# Patient Record
Sex: Female | Born: 1935 | Race: White | Hispanic: No | Marital: Married | Smoking: Never smoker
Health system: Southern US, Community
[De-identification: ages and names within clinical notes are randomized; demographics above are authoritative.]

## PROBLEM LIST (undated history)

## (undated) DIAGNOSIS — R011 Cardiac murmur, unspecified: Secondary | ICD-10-CM

## (undated) DIAGNOSIS — I2510 Atherosclerotic heart disease of native coronary artery without angina pectoris: Secondary | ICD-10-CM

## (undated) DIAGNOSIS — N6019 Diffuse cystic mastopathy of unspecified breast: Secondary | ICD-10-CM

## (undated) DIAGNOSIS — I150 Renovascular hypertension: Secondary | ICD-10-CM

## (undated) DIAGNOSIS — I219 Acute myocardial infarction, unspecified: Secondary | ICD-10-CM

## (undated) DIAGNOSIS — E785 Hyperlipidemia, unspecified: Secondary | ICD-10-CM

## (undated) DIAGNOSIS — D649 Anemia, unspecified: Secondary | ICD-10-CM

## (undated) DIAGNOSIS — K279 Peptic ulcer, site unspecified, unspecified as acute or chronic, without hemorrhage or perforation: Secondary | ICD-10-CM

## (undated) DIAGNOSIS — I878 Other specified disorders of veins: Secondary | ICD-10-CM

## (undated) DIAGNOSIS — K559 Vascular disorder of intestine, unspecified: Secondary | ICD-10-CM

## (undated) DIAGNOSIS — I499 Cardiac arrhythmia, unspecified: Secondary | ICD-10-CM

## (undated) DIAGNOSIS — K219 Gastro-esophageal reflux disease without esophagitis: Secondary | ICD-10-CM

## (undated) DIAGNOSIS — G8929 Other chronic pain: Secondary | ICD-10-CM

## (undated) DIAGNOSIS — I10 Essential (primary) hypertension: Secondary | ICD-10-CM

## (undated) DIAGNOSIS — I509 Heart failure, unspecified: Secondary | ICD-10-CM

## (undated) DIAGNOSIS — E669 Obesity, unspecified: Secondary | ICD-10-CM

## (undated) DIAGNOSIS — N189 Chronic kidney disease, unspecified: Secondary | ICD-10-CM

## (undated) DIAGNOSIS — E079 Disorder of thyroid, unspecified: Secondary | ICD-10-CM

## (undated) DIAGNOSIS — I721 Aneurysm of artery of upper extremity: Secondary | ICD-10-CM

## (undated) HISTORY — PX: MASTECTOMY: SHX3

## (undated) HISTORY — PX: CARDIAC ELECTROPHYSIOLOGY MAPPING AND ABLATION: SHX1292

## (undated) HISTORY — PX: ABDOMINAL HYSTERECTOMY: SHX81

---

## 2004-02-21 ENCOUNTER — Ambulatory Visit: Payer: Self-pay | Admitting: Gastroenterology

## 2004-04-11 ENCOUNTER — Emergency Department: Payer: Self-pay | Admitting: Emergency Medicine

## 2004-04-24 ENCOUNTER — Ambulatory Visit: Payer: Self-pay | Admitting: Internal Medicine

## 2005-04-22 ENCOUNTER — Ambulatory Visit: Payer: Self-pay | Admitting: Unknown Physician Specialty

## 2005-04-29 ENCOUNTER — Inpatient Hospital Stay: Payer: Self-pay | Admitting: Unknown Physician Specialty

## 2006-01-29 ENCOUNTER — Ambulatory Visit: Payer: Self-pay | Admitting: Internal Medicine

## 2006-02-18 ENCOUNTER — Ambulatory Visit: Payer: Self-pay | Admitting: Internal Medicine

## 2006-07-31 ENCOUNTER — Ambulatory Visit: Payer: Self-pay | Admitting: Internal Medicine

## 2007-08-05 ENCOUNTER — Ambulatory Visit: Payer: Self-pay | Admitting: Internal Medicine

## 2008-08-15 ENCOUNTER — Inpatient Hospital Stay: Payer: Self-pay | Admitting: Internal Medicine

## 2010-02-05 ENCOUNTER — Ambulatory Visit: Payer: Self-pay | Admitting: Gastroenterology

## 2010-02-07 LAB — PATHOLOGY REPORT

## 2012-05-20 ENCOUNTER — Ambulatory Visit: Payer: Self-pay | Admitting: Cardiology

## 2012-09-04 ENCOUNTER — Ambulatory Visit: Payer: Self-pay | Admitting: Gastroenterology

## 2012-09-07 LAB — PATHOLOGY REPORT

## 2013-03-31 ENCOUNTER — Emergency Department: Payer: Self-pay | Admitting: Internal Medicine

## 2013-03-31 LAB — COMPREHENSIVE METABOLIC PANEL
ANION GAP: 6 — AB (ref 7–16)
AST: 33 U/L (ref 15–37)
Albumin: 3.4 g/dL (ref 3.4–5.0)
Alkaline Phosphatase: 68 U/L
BUN: 22 mg/dL — ABNORMAL HIGH (ref 7–18)
Bilirubin,Total: 0.6 mg/dL (ref 0.2–1.0)
CALCIUM: 7.9 mg/dL — AB (ref 8.5–10.1)
CO2: 25 mmol/L (ref 21–32)
Chloride: 104 mmol/L (ref 98–107)
Creatinine: 0.94 mg/dL (ref 0.60–1.30)
EGFR (African American): >60
EGFR (Non-African Amer.): 58 — ABNORMAL LOW
GLUCOSE: 115 mg/dL — AB (ref 65–99)
Osmolality: 274 (ref 275–301)
Potassium: 4.7 mmol/L (ref 3.5–5.1)
SGPT (ALT): 26 U/L (ref 12–78)
Sodium: 135 mmol/L — ABNORMAL LOW (ref 136–145)
TOTAL PROTEIN: 6.1 g/dL — AB (ref 6.4–8.2)

## 2013-03-31 LAB — CBC
HCT: 37.3 % (ref 35.0–47.0)
HGB: 13.2 g/dL (ref 12.0–16.0)
MCH: 33.5 pg (ref 26.0–34.0)
MCHC: 35.4 g/dL (ref 32.0–36.0)
MCV: 95 fL (ref 80–100)
PLATELETS: 206 10*3/uL (ref 150–440)
RBC: 3.94 10*6/uL (ref 3.80–5.20)
RDW: 12 % (ref 11.5–14.5)
WBC: 8.3 10*3/uL (ref 3.6–11.0)

## 2013-03-31 LAB — TROPONIN I: Troponin-I: <0.02 ng/mL

## 2014-07-27 ENCOUNTER — Other Ambulatory Visit: Payer: Self-pay | Admitting: Internal Medicine

## 2014-07-27 DIAGNOSIS — E041 Nontoxic single thyroid nodule: Secondary | ICD-10-CM

## 2014-08-04 ENCOUNTER — Ambulatory Visit: Payer: Self-pay

## 2014-08-08 ENCOUNTER — Ambulatory Visit
Admission: RE | Admit: 2014-08-08 | Discharge: 2014-08-08 | Disposition: A | Discharge status: Home / Self Care | Payer: Medicare Other | Source: Ambulatory Visit | Attending: Internal Medicine | Admitting: Internal Medicine

## 2014-08-08 DIAGNOSIS — E041 Nontoxic single thyroid nodule: Secondary | ICD-10-CM | POA: Diagnosis not present

## 2014-08-08 DIAGNOSIS — R591 Generalized enlarged lymph nodes: Secondary | ICD-10-CM | POA: Diagnosis not present

## 2015-11-15 ENCOUNTER — Other Ambulatory Visit: Payer: Self-pay | Admitting: Internal Medicine

## 2015-11-15 DIAGNOSIS — R0602 Shortness of breath: Secondary | ICD-10-CM

## 2015-11-15 DIAGNOSIS — R413 Other amnesia: Secondary | ICD-10-CM

## 2015-11-17 HISTORY — PX: OTHER SURGICAL HISTORY: SHX169

## 2015-11-17 HISTORY — PX: HAND DEBRIDEMENT: SHX974

## 2015-11-27 ENCOUNTER — Ambulatory Visit: Payer: Medicare Other

## 2015-11-27 ENCOUNTER — Ambulatory Visit: Admission: RE | Admit: 2015-11-27 | Payer: Medicare Other | Source: Ambulatory Visit

## 2017-03-24 ENCOUNTER — Other Ambulatory Visit: Payer: Self-pay | Admitting: Internal Medicine

## 2017-03-24 DIAGNOSIS — R202 Paresthesia of skin: Secondary | ICD-10-CM

## 2017-03-24 DIAGNOSIS — M79602 Pain in left arm: Secondary | ICD-10-CM

## 2017-03-24 DIAGNOSIS — M542 Cervicalgia: Secondary | ICD-10-CM

## 2017-03-24 DIAGNOSIS — M79601 Pain in right arm: Secondary | ICD-10-CM

## 2017-03-29 ENCOUNTER — Ambulatory Visit
Admission: RE | Admit: 2017-03-29 | Discharge: 2017-03-29 | Disposition: A | Discharge status: Home / Self Care | Payer: Medicare Other | Source: Ambulatory Visit | Attending: Internal Medicine | Admitting: Internal Medicine

## 2017-03-29 DIAGNOSIS — M79602 Pain in left arm: Secondary | ICD-10-CM | POA: Insufficient documentation

## 2017-03-29 DIAGNOSIS — M4312 Spondylolisthesis, cervical region: Secondary | ICD-10-CM | POA: Insufficient documentation

## 2017-03-29 DIAGNOSIS — M4802 Spinal stenosis, cervical region: Secondary | ICD-10-CM | POA: Diagnosis not present

## 2017-03-29 DIAGNOSIS — R202 Paresthesia of skin: Secondary | ICD-10-CM | POA: Insufficient documentation

## 2017-03-29 DIAGNOSIS — M47812 Spondylosis without myelopathy or radiculopathy, cervical region: Secondary | ICD-10-CM | POA: Diagnosis not present

## 2017-03-29 DIAGNOSIS — M542 Cervicalgia: Secondary | ICD-10-CM

## 2017-03-29 DIAGNOSIS — M79601 Pain in right arm: Secondary | ICD-10-CM | POA: Insufficient documentation

## 2018-11-09 ENCOUNTER — Other Ambulatory Visit: Payer: Self-pay | Admitting: Internal Medicine

## 2018-11-09 DIAGNOSIS — R1010 Upper abdominal pain, unspecified: Secondary | ICD-10-CM

## 2018-11-09 DIAGNOSIS — R194 Change in bowel habit: Secondary | ICD-10-CM

## 2018-11-11 ENCOUNTER — Ambulatory Visit
Admission: RE | Admit: 2018-11-11 | Discharge: 2018-11-11 | Disposition: A | Discharge status: Home / Self Care | Payer: Medicare Other | Source: Ambulatory Visit | Attending: Internal Medicine | Admitting: Internal Medicine

## 2018-11-11 ENCOUNTER — Other Ambulatory Visit: Payer: Self-pay

## 2018-11-11 DIAGNOSIS — R1010 Upper abdominal pain, unspecified: Secondary | ICD-10-CM | POA: Insufficient documentation

## 2018-11-11 DIAGNOSIS — R194 Change in bowel habit: Secondary | ICD-10-CM | POA: Diagnosis present

## 2018-11-11 HISTORY — DX: Essential (primary) hypertension: I10

## 2018-11-11 MED ORDER — IOHEXOL 300 MG/ML  SOLN
75.0000 mL | Freq: Once | INTRAMUSCULAR | Status: AC | PRN
  Administered 2018-11-11: 75 mL via INTRAVENOUS

## 2018-11-24 ENCOUNTER — Other Ambulatory Visit: Payer: Self-pay

## 2018-11-24 ENCOUNTER — Other Ambulatory Visit
Admission: RE | Admit: 2018-11-24 | Discharge: 2018-11-24 | Disposition: A | Discharge status: Home / Self Care | Payer: Medicare Other | Source: Ambulatory Visit | Attending: Gastroenterology | Admitting: Gastroenterology

## 2018-11-24 DIAGNOSIS — Z20828 Contact with and (suspected) exposure to other viral communicable diseases: Secondary | ICD-10-CM | POA: Insufficient documentation

## 2018-11-24 DIAGNOSIS — Z01812 Encounter for preprocedural laboratory examination: Secondary | ICD-10-CM | POA: Diagnosis present

## 2018-11-25 ENCOUNTER — Encounter: Payer: Self-pay | Admitting: *Deleted

## 2018-11-25 LAB — SARS CORONAVIRUS 2 (TAT 6-24 HRS): SARS Coronavirus 2: NEGATIVE

## 2018-11-26 ENCOUNTER — Ambulatory Visit
Admission: RE | Admit: 2018-11-26 | Discharge: 2018-11-26 | Disposition: A | Discharge status: Home / Self Care | Payer: Medicare Other | Attending: Gastroenterology | Admitting: Gastroenterology

## 2018-11-26 ENCOUNTER — Ambulatory Visit: Payer: Medicare Other | Admitting: Anesthesiology

## 2018-11-26 ENCOUNTER — Other Ambulatory Visit: Payer: Self-pay

## 2018-11-26 ENCOUNTER — Encounter
Admission: RE | Disposition: A | Discharge status: Home / Self Care | Payer: Self-pay | Source: Home / Self Care | Attending: Gastroenterology

## 2018-11-26 DIAGNOSIS — N189 Chronic kidney disease, unspecified: Secondary | ICD-10-CM | POA: Insufficient documentation

## 2018-11-26 DIAGNOSIS — I4891 Unspecified atrial fibrillation: Secondary | ICD-10-CM | POA: Insufficient documentation

## 2018-11-26 DIAGNOSIS — R933 Abnormal findings on diagnostic imaging of other parts of digestive tract: Secondary | ICD-10-CM | POA: Diagnosis present

## 2018-11-26 DIAGNOSIS — Z7901 Long term (current) use of anticoagulants: Secondary | ICD-10-CM | POA: Diagnosis not present

## 2018-11-26 DIAGNOSIS — D123 Benign neoplasm of transverse colon: Secondary | ICD-10-CM | POA: Diagnosis not present

## 2018-11-26 DIAGNOSIS — E785 Hyperlipidemia, unspecified: Secondary | ICD-10-CM | POA: Diagnosis not present

## 2018-11-26 DIAGNOSIS — E079 Disorder of thyroid, unspecified: Secondary | ICD-10-CM | POA: Diagnosis not present

## 2018-11-26 DIAGNOSIS — K5730 Diverticulosis of large intestine without perforation or abscess without bleeding: Secondary | ICD-10-CM | POA: Diagnosis not present

## 2018-11-26 DIAGNOSIS — I2510 Atherosclerotic heart disease of native coronary artery without angina pectoris: Secondary | ICD-10-CM | POA: Insufficient documentation

## 2018-11-26 DIAGNOSIS — Z79890 Hormone replacement therapy (postmenopausal): Secondary | ICD-10-CM | POA: Diagnosis not present

## 2018-11-26 DIAGNOSIS — I509 Heart failure, unspecified: Secondary | ICD-10-CM | POA: Insufficient documentation

## 2018-11-26 DIAGNOSIS — Z79899 Other long term (current) drug therapy: Secondary | ICD-10-CM | POA: Diagnosis not present

## 2018-11-26 DIAGNOSIS — K219 Gastro-esophageal reflux disease without esophagitis: Secondary | ICD-10-CM | POA: Diagnosis not present

## 2018-11-26 DIAGNOSIS — I130 Hypertensive heart and chronic kidney disease with heart failure and stage 1 through stage 4 chronic kidney disease, or unspecified chronic kidney disease: Secondary | ICD-10-CM | POA: Diagnosis not present

## 2018-11-26 HISTORY — DX: Heart failure, unspecified: I50.9

## 2018-11-26 HISTORY — DX: Disorder of thyroid, unspecified: E07.9

## 2018-11-26 HISTORY — DX: Hyperlipidemia, unspecified: E78.5

## 2018-11-26 HISTORY — DX: Atherosclerotic heart disease of native coronary artery without angina pectoris: I25.10

## 2018-11-26 HISTORY — DX: Chronic kidney disease, unspecified: N18.9

## 2018-11-26 HISTORY — DX: Other chronic pain: G89.29

## 2018-11-26 HISTORY — DX: Renovascular hypertension: I15.0

## 2018-11-26 HISTORY — DX: Obesity, unspecified: E66.9

## 2018-11-26 HISTORY — DX: Vascular disorder of intestine, unspecified: K55.9

## 2018-11-26 HISTORY — DX: Other specified disorders of veins: I87.8

## 2018-11-26 HISTORY — DX: Peptic ulcer, site unspecified, unspecified as acute or chronic, without hemorrhage or perforation: K27.9

## 2018-11-26 HISTORY — DX: Gastro-esophageal reflux disease without esophagitis: K21.9

## 2018-11-26 HISTORY — DX: Diffuse cystic mastopathy of unspecified breast: N60.19

## 2018-11-26 HISTORY — DX: Anemia, unspecified: D64.9

## 2018-11-26 HISTORY — PX: COLONOSCOPY WITH PROPOFOL: SHX5780

## 2018-11-26 HISTORY — DX: Cardiac arrhythmia, unspecified: I49.9

## 2018-11-26 SURGERY — COLONOSCOPY WITH PROPOFOL
Anesthesia: General

## 2018-11-26 MED ORDER — SODIUM CHLORIDE 0.9 % IV SOLN
INTRAVENOUS | Status: DC
  Administered 2018-11-26: 11:00:00 1000 mL via INTRAVENOUS

## 2018-11-26 MED ORDER — PROPOFOL 10 MG/ML IV BOLUS
INTRAVENOUS | Status: DC | PRN
  Administered 2018-11-26: 80 mg via INTRAVENOUS

## 2018-11-26 MED ORDER — PROPOFOL 500 MG/50ML IV EMUL
INTRAVENOUS | Status: AC
  Filled 2018-11-26: qty 50

## 2018-11-26 MED ORDER — PROPOFOL 500 MG/50ML IV EMUL
INTRAVENOUS | Status: DC | PRN
  Administered 2018-11-26: 130 ug/kg/min via INTRAVENOUS

## 2018-11-26 NOTE — H&P (Signed)
Outpatient short stay form Pre-procedure 11/26/2018 10:55 AM Lollie Sails MD  Primary Physician: Dr. Fulton Reek  Reason for visit: Colonoscopy  History of present illness: Patient is a 85 year old female presenting today for colonoscopy.  She has had a change in bowel habits with increased constipation for.  About a month.  She did have a CT scan of the abdomen pelvis on 11/11/2018.  This showed an abrupt caliber change in the colon at the level of splenic flexure with a large stool burden in the more proximal colon.  Descending colon through the rectum were decompressed.  There was no obvious mass however she is presenting today for further evaluation.  Her last EGD and colonoscopy were 08/04/2012 EGD being normal colonoscopy showing diverticulosis and hemorrhoids.  She tolerated her prep well.  Patient does take Eliquis and her last dose was over 48 hours ago      Current Facility-Administered Medications:  .  0.9 %  sodium chloride infusion, , Intravenous, Continuous, Lollie Sails, MD, Last Rate: 20 mL/hr at 11/26/18 1037, 1,000 mL at 11/26/18 1037  Medications Prior to Admission  Medication Sig Dispense Refill Last Dose  . acetaminophen (TYLENOL) 500 MG tablet Take 1,000 mg by mouth every 6 (six) hours as needed.   Past Week at Unknown time  . ALPRAZolam (XANAX) 0.25 MG tablet Take 0.25 mg by mouth 3 (three) times daily as needed for anxiety or sleep.   11/26/2018 at 0700  . apixaban (ELIQUIS) 5 MG TABS tablet Take 5 mg by mouth every 12 (twelve) hours.   Past Week at Unknown time  . carvedilol (COREG) 25 MG tablet Take 25 mg by mouth every 12 (twelve) hours.   11/25/2018 at Unknown time  . cloNIDine (CATAPRES) 0.1 MG tablet Take 0.1 mg by mouth 2 (two) times daily. Take 2 tablets with breakfast and lunch, 3 tablets at bedtime   11/26/2018 at 0700  . diclofenac sodium (VOLTAREN) 1 % GEL Apply 2 g topically 4 (four) times daily.   Past Week at Unknown time  .  DIPHENHYDRAMINE-PE-APAP PO Take 1 tablet by mouth at bedtime as needed ("Legatrin PM" OTC. For muscle cramps and sleep PRN).   Past Week at Unknown time  . ferrous sulfate 325 (65 FE) MG tablet Take 325 mg by mouth daily with breakfast.   Past Week at Unknown time  . furosemide (LASIX) 40 MG tablet Take 40 mg by mouth 2 (two) times daily.   11/25/2018 at Unknown time  . gabapentin (NEURONTIN) 300 MG capsule Take 300 mg by mouth at bedtime.   Past Week at Unknown time  . hydrALAZINE (APRESOLINE) 50 MG tablet Take 50 mg by mouth 3 (three) times daily.   Past Week at Unknown time  . lansoprazole (PREVACID) 15 MG capsule Take 15 mg by mouth 2 (two) times daily.   11/25/2018 at Unknown time  . levothyroxine (SYNTHROID) 75 MCG tablet Take 75 mcg by mouth daily before breakfast. Take qd on empty stomach with a glass of water at least 30-60 minutes before breakfast   11/25/2018 at Unknown time  . losartan (COZAAR) 100 MG tablet Take 100 mg by mouth daily.   11/25/2018 at Unknown time  . metoCLOPramide (REGLAN) 5 MG tablet Take 5 mg by mouth 3 (three) times daily before meals. For 30 days   Past Week at Unknown time  . pravastatin (PRAVACHOL) 80 MG tablet Take 80 mg by mouth daily. At night   11/25/2018 at Unknown time  .  senna (SENOKOT) 8.6 MG tablet Take 1 tablet by mouth daily.   Past Week at Unknown time  . triamcinolone cream (KENALOG) 0.5 % Apply 1 application topically 2 (two) times daily.   Past Week at Unknown time  . verapamil (CALAN-SR) 180 MG CR tablet Take 180 mg by mouth daily.   Past Week at Unknown time     Allergies  Allergen Reactions  . Amlodipine   . Codeine   . Cortisone   . Lisinopril   . Phenergan [Promethazine]   . Prednisone   . Spironolactone   . Tikosyn [Dofetilide]      Past Medical History:  Diagnosis Date  . Anemia   . CHF (congestive heart failure) ([place])   . Chronic back pain   . Chronic kidney disease   . Coronary artery disease   . Dysrhythmia    A-fib, ventricular  ectopy with palpitations  . Fibrocystic breast disease   . GERD (gastroesophageal reflux disease)   . Hyperlipidemia   . Hypertension   . Ischemic colitis ([place])   . Obesity   . Peptic ulcer disease   . Renovascular hypertension   . Thyroid disease   . Venous stasis     Review of systems:      Physical Exam    Heart and lungs: Regular rate and rhythm without rub or gallop lungs are bilaterally clear    HEENT: Normocephalic atraumatic eyes are anicteric    Other:    Pertinant exam for procedure: Soft mild tenderness to palpation in the right upper quadrant.  There are no masses or rebound.  Sounds are positive normoactive    Planned proceedures: Colonoscopy and indicated procedures. I have discussed the risks benefits and complications of procedures to include not limited to bleeding, infection, perforation and the risk of sedation and the patient wishes to proceed.    Lollie Sails, MD Gastroenterology 11/26/2018  10:55 AM

## 2018-11-26 NOTE — Anesthesia Post-op Follow-up Note (Signed)
Anesthesia QCDR form completed.        

## 2018-11-26 NOTE — Transfer of Care (Signed)
Immediate Anesthesia Transfer of Care Note  Patient: Andrea Haley  Procedure(s) Performed: COLONOSCOPY WITH PROPOFO (N/A )  Patient Location: PACU  Anesthesia Type:General  Level of Consciousness: awake  Airway & Oxygen Therapy: Patient Spontanous Breathing and Patient connected to nasal cannula oxygen  Post-op Assessment: Report given to RN and Post -op Vital signs reviewed and stable  Post vital signs: Reviewed and stable  Last Vitals:  Vitals Value Taken Time  BP 159/68 11/26/18 1313  Temp    Pulse 58 11/26/18 1315  Resp 15 11/26/18 1315  SpO2 99 % 11/26/18 1315  Vitals shown include unvalidated device data.  Last Pain:  Vitals:   11/26/18 1020  TempSrc: Tympanic  PainSc: 0-No pain         Complications: No apparent anesthesia complications

## 2018-11-26 NOTE — Op Note (Signed)
[facility] Gastroenterology Patient Name: Jessamyn Letson Procedure Date: 11/26/2018 12:08 PM MRN: [id number] Account #: [id number] Date of Birth: [birth year] Admit Type: Outpatient Age: 85 Room: [facility] ENDO ROOM 3 Gender: Female Note Status: Finalized Procedure:            Colonoscopy Indications:          Abnormal CT of the GI tract Providers:            Lollie Sails, MD Complications:        No immediate complications. Procedure:            Pre-Anesthesia Assessment:                       - ASA Grade Assessment: III - A patient with severe                        systemic disease.                       After obtaining informed consent, the colonoscope was                        passed under direct vision. Throughout the procedure,                        the patient's blood pressure, pulse, and oxygen                        saturations were monitored continuously. The                        Colonoscope was introduced through the anus and                        advanced to the the cecum, identified by appendiceal                        orifice and ileocecal valve. The colonoscopy was                        performed without difficulty. The patient tolerated the                        procedure well. The quality of the bowel preparation                        was good. Findings:      Multiple small-mouthed diverticula were found in the sigmoid colon and       descending colon.      A 4 mm polyp was found in the proximal transverse colon. The polyp was       sessile. The polyp was removed with a cold snare. Resection and       retrieval were complete.      The exam was otherwise normal throughout the examined colon.      The terminal ileum appeared normal. Impression:           - Diverticulosis in the sigmoid colon and in the                        descending colon.                       -  One 4 mm polyp in the proximal transverse colon,   removed with a cold snare. Resected and retrieved.                       - The examined portion of the ileum was normal. Recommendation:       - Discharge patient to home.                       - Miralax 1 capful (17 grams) in 8 ounces of water PO                        daily.                       - Return to GI clinic in 3 weeks. Procedure Code(s):    --- Professional ---                       [phone], Colonoscopy, flexible; with removal of tumor(s),                        polyp(s), or other lesion(s) by snare technique Diagnosis Code(s):    --- Professional ---                       K63.5, Polyp of colon                       K57.30, Diverticulosis of large intestine without                        perforation or abscess without bleeding                       R93.3, Abnormal findings on diagnostic imaging of other                        parts of digestive tract CPT copyright 2019 American Medical Association. All rights reserved. The codes documented in this report are preliminary and upon coder review may  be revised to meet current compliance requirements. Lollie Sails, MD 11/26/2018 1:07:42 PM This report has been signed electronically. Number of Addenda: 0 Note Initiated On: 11/26/2018 12:08 PM Scope Withdrawal Time: 0 hours 8 minutes 17 seconds  Total Procedure Duration: 0 hours 26 minutes 52 seconds       [facility]

## 2018-11-26 NOTE — Anesthesia Preprocedure Evaluation (Signed)
Anesthesia Evaluation  Patient identified by MRN, date of birth, ID band Patient awake    Reviewed: Allergy & Precautions, H&P , NPO status , Patient's Chart, lab work & pertinent test results  Airway Mallampati: II  TM Distance: >3 FB Neck ROM: full    Dental  (+) Upper Dentures, Lower Dentures   Pulmonary neg pulmonary ROS,           Cardiovascular hypertension, + CAD and +CHF  + dysrhythmias Atrial Fibrillation      Neuro/Psych negative neurological ROS  negative psych ROS   GI/Hepatic Neg liver ROS, PUD, GERD  Controlled,  Endo/Other  negative endocrine ROS  Renal/GU CRFRenal disease  negative genitourinary   Musculoskeletal   Abdominal   Peds  Hematology   Anesthesia Other Findings Past Medical History: No date: Anemia No date: CHF (congestive heart failure) (HCC) No date: Chronic back pain No date: Chronic kidney disease No date: Coronary artery disease No date: Dysrhythmia     Comment:  A-fib, ventricular ectopy with palpitations No date: Fibrocystic breast disease No date: GERD (gastroesophageal reflux disease) No date: Hyperlipidemia No date: Hypertension No date: Ischemic colitis (HCC) No date: Obesity No date: Peptic ulcer disease No date: Renovascular hypertension No date: Thyroid disease No date: Venous stasis    BMI    Body Mass Index: 27.92 kg/m      Reproductive/Obstetrics negative OB ROS                             Anesthesia Physical Anesthesia Plan  ASA: III  Anesthesia Plan: General   Post-op Pain Management:    Induction:   PONV Risk Score and Plan: Propofol infusion and TIVA  Airway Management Planned: Natural Airway and Nasal Cannula  Additional Equipment:   Intra-op Plan:   Post-operative Plan:   Informed Consent: I have reviewed the patients History and Physical, chart, labs and discussed the procedure including the risks, benefits  and alternatives for the proposed anesthesia with the patient or authorized representative who has indicated his/her understanding and acceptance.     Dental Advisory Given  Plan Discussed with: Anesthesiologist and CRNA  Anesthesia Plan Comments:         Anesthesia Quick Evaluation

## 2018-11-27 ENCOUNTER — Encounter: Payer: Self-pay | Admitting: Gastroenterology

## 2018-11-27 LAB — SURGICAL PATHOLOGY

## 2018-11-27 NOTE — Anesthesia Postprocedure Evaluation (Signed)
Anesthesia Post Note  Patient: Andrea Haley  Procedure(s) Performed: COLONOSCOPY WITH PROPOFO (N/A )  Patient location during evaluation: PACU Anesthesia Type: General Level of consciousness: awake and alert Pain management: pain level controlled Vital Signs Assessment: post-procedure vital signs reviewed and stable Respiratory status: spontaneous breathing, nonlabored ventilation and respiratory function stable Cardiovascular status: blood pressure returned to baseline and stable Postop Assessment: no apparent nausea or vomiting Anesthetic complications: no     Last Vitals:  Vitals:   11/26/18 1333 11/26/18 1353  BP: (!) 164/68 (!) 168/74  Pulse:    Resp:    Temp:    SpO2:      Last Pain:  Vitals:   11/27/18 0717  TempSrc:   PainSc: 0-No pain                 Durenda Hurt

## 2019-04-26 ENCOUNTER — Other Ambulatory Visit: Payer: Self-pay | Admitting: General Surgery

## 2019-04-26 MED ORDER — GENERIC EXTERNAL MEDICATION
Status: DC
Start: ? — End: 2019-04-26

## 2019-04-26 MED ORDER — ALPRAZOLAM 0.25 MG PO TABS
0.25 | ORAL_TABLET | ORAL | Status: DC
Start: 2019-04-24 — End: 2019-04-26

## 2019-04-26 MED ORDER — GABAPENTIN 300 MG PO CAPS
300.00 | ORAL_CAPSULE | ORAL | Status: DC
Start: ? — End: 2019-04-26

## 2019-04-26 MED ORDER — APIXABAN 5 MG PO TABS
5.00 | ORAL_TABLET | ORAL | Status: DC
Start: 2019-04-24 — End: 2019-04-26

## 2019-04-26 MED ORDER — LOSARTAN POTASSIUM 50 MG PO TABS
100.00 | ORAL_TABLET | ORAL | Status: DC
Start: ? — End: 2019-04-26

## 2019-04-26 MED ORDER — HYDRALAZINE HCL 50 MG PO TABS
50.00 | ORAL_TABLET | ORAL | Status: DC
Start: 2019-04-24 — End: 2019-04-26

## 2019-04-26 MED ORDER — PRAVASTATIN SODIUM 20 MG PO TABS
80.00 | ORAL_TABLET | ORAL | Status: DC
Start: ? — End: 2019-04-26

## 2019-04-26 MED ORDER — VERAPAMIL HCL ER 180 MG PO TBCR
180.00 | EXTENDED_RELEASE_TABLET | ORAL | Status: DC
Start: 2019-04-25 — End: 2019-04-26

## 2019-04-26 MED ORDER — LEVOTHYROXINE SODIUM 75 MCG PO TABS
75.00 | ORAL_TABLET | ORAL | Status: DC
Start: 2019-04-25 — End: 2019-04-26

## 2019-04-26 MED ORDER — CARVEDILOL 25 MG PO TABS
25.00 | ORAL_TABLET | ORAL | Status: DC
Start: 2019-04-24 — End: 2019-04-26

## 2019-04-26 MED ORDER — FUROSEMIDE 40 MG PO TABS
40.00 | ORAL_TABLET | ORAL | Status: DC
Start: 2019-04-25 — End: 2019-04-26

## 2019-04-26 MED ORDER — CLONIDINE HCL 0.3 MG PO TABS
0.30 | ORAL_TABLET | ORAL | Status: DC
Start: ? — End: 2019-04-26

## 2019-04-27 ENCOUNTER — Other Ambulatory Visit: Payer: Self-pay | Admitting: General Surgery

## 2019-04-27 ENCOUNTER — Encounter: Payer: Self-pay | Admitting: General Surgery

## 2019-04-27 NOTE — H&P (Signed)
Andrea HELDENBRAND [id number] Dec 01, 1931     HPI:  86 year old woman recently reporting decreased exercise tolerance, fatigue as well as weakness with SOB.  BP was found to be low, ans subsequent CBC showed a profound fall in her HGB from her baseline of 9.8 down to 6.7.  She was subsequently sent to [facility] ED for transfusion (multiple known anti-bodies making crossmatch difficult) and the discharged for outpatient GI follow up.  The daughter called Nathaniel Man( (by her report) and was told the earliest the patient could get an appointment was September.  She is seen today to discuss endoscopic evaluation. She reports her stools are dark from longstanding iron therapy.  She has not seen any gross blood.  Stools are chronically described as "tarry" which she has attributed to the iron therapy.   Comprehenisve metablic panel of [id number] did not show an elevated BUN to suggest an UGI bleed.   Colonoscopy completed for constipation and an abnormal CT showing a transition point at the splenic flexure identified a 4 mm polyp (tubular adenoma) and diverticulosis.    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 Baseline HGB 11-12  From 2017- 2020.   The patient reports she has been in sinus rhythm since cardiac ablation 4 years ago.   Frequently labile blood pressure on record review.   (Not in a hospital admission)  Allergies  Allergen Reactions  . Amlodipine   . Codeine   . Cortisone   . Lisinopril   . Phenergan [Promethazine]   . Prednisone   . Spironolactone   . Tikosyn [Dofetilide]    Past Medical History:  Diagnosis Date  . Anemia   . CHF (congestive heart failure) ([place])   . Chronic back pain   . Chronic kidney disease   . Coronary artery disease   . Dysrhythmia    A-fib, ventricular ectopy with palpitations  . Fibrocystic breast disease   . GERD (gastroesophageal reflux disease)   . Hyperlipidemia   . Hypertension   .  Ischemic colitis ([place])   . Obesity   . Peptic ulcer disease   . Renovascular hypertension   . Thyroid disease   . Venous stasis    Social History   Socioeconomic History  . Marital status: Married    Spouse name: Not on file  . Number of children: Not on file  . Years of education: Not on file  . Highest education level: Not on file  Occupational History  . Not on file  Tobacco Use  . Smoking status: Not on file  Substance and Sexual Activity  . Alcohol use: Not on file  . Drug use: Not on file  . Sexual activity: Not on file  Other Topics Concern  . Not on file  Social History Narrative  . Not on file   Social Determinants of Health   Financial Resource Strain:   . Difficulty of Paying Living Expenses: Not on file  Food Insecurity:   . Worried About Charity fundraiser in the Last Year: Not on file  . Ran Out of Food in the Last Year: Not on file  Transportation Needs:   . Lack of Transportation (Medical): Not on file  . Lack of Transportation (Non-Medical): Not on file  Physical Activity:   . Days of Exercise per Week: Not on file  . Minutes of Exercise per Session: Not on file  Stress:   . Feeling of  Stress : Not on file  Social Connections:   . Frequency of Communication with Friends and Family: Not on file  . Frequency of Social Gatherings with Friends and Family: Not on file  . Attends Religious Services: Not on file  . Active Member of Clubs or Organizations: Not on file  . Attends Archivist Meetings: Not on file  . Marital Status: Not on file  Intimate Partner Violence:   . Fear of Current or Ex-Partner: Not on file  . Emotionally Abused: Not on file  . Physically Abused: Not on file  . Sexually Abused: Not on file   Social History   Social History Narrative  . Not on file     ROS: Negative.     PE: HEENT: Negative. Lungs: Clear. Cardio: RR.  The patient has held her Eliquis since the AM dose on April 26, 2019 anticipating  EGD on February 10.   Assessment/Plan:  Proceed with planned endoscopy.  Forest Gleason [facility] 04/27/2019

## 2019-04-28 ENCOUNTER — Encounter
Admission: RE | Disposition: A | Discharge status: Home / Self Care | Payer: Self-pay | Source: Home / Self Care | Attending: General Surgery

## 2019-04-28 ENCOUNTER — Encounter: Payer: Self-pay | Admitting: General Surgery

## 2019-04-28 ENCOUNTER — Other Ambulatory Visit: Payer: Self-pay

## 2019-04-28 ENCOUNTER — Ambulatory Visit: Payer: Medicare PPO | Admitting: Registered Nurse

## 2019-04-28 ENCOUNTER — Ambulatory Visit
Admission: RE | Admit: 2019-04-28 | Discharge: 2019-04-28 | Disposition: A | Discharge status: Home / Self Care | Payer: Medicare PPO | Attending: General Surgery | Admitting: General Surgery

## 2019-04-28 DIAGNOSIS — K449 Diaphragmatic hernia without obstruction or gangrene: Secondary | ICD-10-CM | POA: Insufficient documentation

## 2019-04-28 DIAGNOSIS — Z8249 Family history of ischemic heart disease and other diseases of the circulatory system: Secondary | ICD-10-CM | POA: Insufficient documentation

## 2019-04-28 DIAGNOSIS — N6019 Diffuse cystic mastopathy of unspecified breast: Secondary | ICD-10-CM | POA: Insufficient documentation

## 2019-04-28 DIAGNOSIS — I2510 Atherosclerotic heart disease of native coronary artery without angina pectoris: Secondary | ICD-10-CM | POA: Insufficient documentation

## 2019-04-28 DIAGNOSIS — Z79899 Other long term (current) drug therapy: Secondary | ICD-10-CM | POA: Insufficient documentation

## 2019-04-28 DIAGNOSIS — I252 Old myocardial infarction: Secondary | ICD-10-CM | POA: Diagnosis not present

## 2019-04-28 DIAGNOSIS — Z9013 Acquired absence of bilateral breasts and nipples: Secondary | ICD-10-CM | POA: Diagnosis not present

## 2019-04-28 DIAGNOSIS — I509 Heart failure, unspecified: Secondary | ICD-10-CM | POA: Diagnosis not present

## 2019-04-28 DIAGNOSIS — R011 Cardiac murmur, unspecified: Secondary | ICD-10-CM | POA: Insufficient documentation

## 2019-04-28 DIAGNOSIS — Z90710 Acquired absence of both cervix and uterus: Secondary | ICD-10-CM | POA: Insufficient documentation

## 2019-04-28 DIAGNOSIS — I4891 Unspecified atrial fibrillation: Secondary | ICD-10-CM | POA: Insufficient documentation

## 2019-04-28 DIAGNOSIS — K921 Melena: Secondary | ICD-10-CM | POA: Insufficient documentation

## 2019-04-28 DIAGNOSIS — R002 Palpitations: Secondary | ICD-10-CM | POA: Diagnosis not present

## 2019-04-28 DIAGNOSIS — I130 Hypertensive heart and chronic kidney disease with heart failure and stage 1 through stage 4 chronic kidney disease, or unspecified chronic kidney disease: Secondary | ICD-10-CM | POA: Insufficient documentation

## 2019-04-28 DIAGNOSIS — Z87891 Personal history of nicotine dependence: Secondary | ICD-10-CM | POA: Insufficient documentation

## 2019-04-28 DIAGNOSIS — I721 Aneurysm of artery of upper extremity: Secondary | ICD-10-CM | POA: Diagnosis not present

## 2019-04-28 DIAGNOSIS — E079 Disorder of thyroid, unspecified: Secondary | ICD-10-CM | POA: Diagnosis not present

## 2019-04-28 DIAGNOSIS — Z888 Allergy status to other drugs, medicaments and biological substances status: Secondary | ICD-10-CM | POA: Insufficient documentation

## 2019-04-28 DIAGNOSIS — K279 Peptic ulcer, site unspecified, unspecified as acute or chronic, without hemorrhage or perforation: Secondary | ICD-10-CM | POA: Diagnosis not present

## 2019-04-28 DIAGNOSIS — G8929 Other chronic pain: Secondary | ICD-10-CM | POA: Insufficient documentation

## 2019-04-28 DIAGNOSIS — I878 Other specified disorders of veins: Secondary | ICD-10-CM | POA: Diagnosis not present

## 2019-04-28 DIAGNOSIS — M549 Dorsalgia, unspecified: Secondary | ICD-10-CM | POA: Diagnosis not present

## 2019-04-28 DIAGNOSIS — K219 Gastro-esophageal reflux disease without esophagitis: Secondary | ICD-10-CM | POA: Insufficient documentation

## 2019-04-28 DIAGNOSIS — Z885 Allergy status to narcotic agent status: Secondary | ICD-10-CM | POA: Insufficient documentation

## 2019-04-28 DIAGNOSIS — D631 Anemia in chronic kidney disease: Secondary | ICD-10-CM | POA: Insufficient documentation

## 2019-04-28 DIAGNOSIS — N189 Chronic kidney disease, unspecified: Secondary | ICD-10-CM | POA: Diagnosis not present

## 2019-04-28 DIAGNOSIS — K3189 Other diseases of stomach and duodenum: Secondary | ICD-10-CM | POA: Diagnosis not present

## 2019-04-28 DIAGNOSIS — E785 Hyperlipidemia, unspecified: Secondary | ICD-10-CM | POA: Diagnosis not present

## 2019-04-28 DIAGNOSIS — Z7901 Long term (current) use of anticoagulants: Secondary | ICD-10-CM | POA: Insufficient documentation

## 2019-04-28 HISTORY — DX: Cardiac murmur, unspecified: R01.1

## 2019-04-28 HISTORY — DX: Aneurysm of artery of upper extremity: I72.1

## 2019-04-28 HISTORY — PX: ESOPHAGOGASTRODUODENOSCOPY (EGD) WITH PROPOFOL: SHX5813

## 2019-04-28 HISTORY — DX: Acute myocardial infarction, unspecified: I21.9

## 2019-04-28 SURGERY — ESOPHAGOGASTRODUODENOSCOPY (EGD) WITH PROPOFOL
Anesthesia: General

## 2019-04-28 MED ORDER — PROPOFOL 10 MG/ML IV BOLUS
INTRAVENOUS | Status: DC | PRN
  Administered 2019-04-28: 10 mg via INTRAVENOUS
  Administered 2019-04-28: 60 mg via INTRAVENOUS

## 2019-04-28 MED ORDER — SODIUM CHLORIDE 0.9 % IV SOLN: INTRAVENOUS | Status: DC

## 2019-04-28 MED ORDER — LIDOCAINE HCL (CARDIAC) PF 100 MG/5ML IV SOSY
PREFILLED_SYRINGE | INTRAVENOUS | Status: DC | PRN
  Administered 2019-04-28: 60 mg via INTRAVENOUS

## 2019-04-28 NOTE — Anesthesia Preprocedure Evaluation (Addendum)
Anesthesia Evaluation  Patient identified by MRN, date of birth, ID band Patient awake    Reviewed: Allergy & Precautions, H&P , NPO status , Patient's Chart, lab work & pertinent test results  Airway Mallampati: II  TM Distance: >3 FB Neck ROM: full    Dental  (+) Edentulous Lower, Edentulous Upper   Pulmonary neg pulmonary ROS, neg shortness of breath, neg COPD,           Cardiovascular hypertension, (-) angina+ CAD, + Past MI and +CHF  + dysrhythmias Atrial Fibrillation + Valvular Problems/Murmurs   NORMAL LEFT VENTRICULAR SYSTOLIC FUNCTION WITH MILD LVH  NORMAL RIGHT VENTRICULAR SYSTOLIC FUNCTION  VALVULAR REGURGITATION: TRIVIAL AR, MILD MR, TRIVIAL PR, MILD TR  NO VALVULAR STENOSIS  PFO SEEN BY COLOR AND SPECTRAL DOPPLER.   Neuro/Psych negative neurological ROS  negative psych ROS   GI/Hepatic Neg liver ROS, PUD, GERD  ,  Endo/Other  negative endocrine ROS  Renal/GU Renal disease  negative genitourinary   Musculoskeletal   Abdominal   Peds  Hematology  (+) Blood dyscrasia, anemia ,   Anesthesia Other Findings Past Medical History: No date: Anemia No date: Aneurysm of left radial artery (HCC) No date: CHF (congestive heart failure) (HCC) No date: Chronic back pain No date: Chronic kidney disease No date: Coronary artery disease No date: Dysrhythmia     Comment:  A-fib, ventricular ectopy with palpitations No date: Fibrocystic breast disease No date: GERD (gastroesophageal reflux disease) No date: Heart murmur No date: Hyperlipidemia No date: Hypertension No date: Ischemic colitis ([place]) No date: Myocardial infarction ([place]) No date: Obesity No date: Peptic ulcer disease No date: Renovascular hypertension No date: Thyroid disease No date: Venous stasis  Past Surgical History: No date: ABDOMINAL HYSTERECTOMY No date: CARDIAC ELECTROPHYSIOLOGY MAPPING AND ABLATION 11/26/2018: COLONOSCOPY WITH  PROPOFOL; N/A     Comment:  Procedure: COLONOSCOPY WITH PROPOFO;  Surgeon: Lollie Sails, MD;  Location: [facility] ENDOSCOPY;  Service:               Endoscopy;  Laterality: N/A; 11/2015: HAND DEBRIDEMENT; Left     Comment:  Debridement skin/SQ tissue, wrist/hand/finger No date: MASTECTOMY; Bilateral 11/2015: radial artery repair; Left     Comment:  aneurysm/pseudoaneurysm repair, graft insertion of               radial/ulnar artery     Reproductive/Obstetrics negative OB ROS                            Anesthesia Physical Anesthesia Plan  ASA: III  Anesthesia Plan: General   Post-op Pain Management:    Induction:   PONV Risk Score and Plan: Propofol infusion and TIVA  Airway Management Planned: Natural Airway and Nasal Cannula  Additional Equipment:   Intra-op Plan:   Post-operative Plan:   Informed Consent: I have reviewed the patients History and Physical, chart, labs and discussed the procedure including the risks, benefits and alternatives for the proposed anesthesia with the patient or authorized representative who has indicated his/her understanding and acceptance.     Dental Advisory Given  Plan Discussed with: Anesthesiologist  Anesthesia Plan Comments:        Anesthesia Quick Evaluation

## 2019-04-28 NOTE — Transfer of Care (Signed)
Immediate Anesthesia Transfer of Care Note  Patient: [name]  Procedure(s) Performed: ESOPHAGOGASTRODUODENOSCOPY (EGD) WITH PROPOFOL (N/A )  Patient Location: PACU  Anesthesia Type:General  Level of Consciousness: sedated  Airway & Oxygen Therapy: Patient Spontanous Breathing  Post-op Assessment: Report given to RN and Post -op Vital signs reviewed and stable  Post vital signs: Reviewed and stable  Last Vitals:  Vitals Value Taken Time  BP 144/50 04/28/19 0902  Temp    Pulse 57 04/28/19 0903  Resp 15 04/28/19 0903  SpO2 100 % 04/28/19 0903  Vitals shown include unvalidated device data.  Last Pain: There were no vitals filed for this visit.       Complications: No apparent anesthesia complications

## 2019-04-28 NOTE — H&P (Signed)
No change from yesterday's exam.

## 2019-04-28 NOTE — Op Note (Signed)
[facility] Gastroenterology Patient Name: Andrea Haley Procedure Date: 04/28/2019 8:48 AM MRN: [id number] Account #: [id number] Date of Birth: 05/07/1931 Admit Type: Outpatient Age: 86 Room: [facility] ENDO ROOM 1 Gender: Female Note Status: Finalized Procedure:             Upper GI endoscopy Indications:           Melena Providers:             Robert Bellow, MD Medicines:             Monitored Anesthesia Care Complications:         No immediate complications. Procedure:             Pre-Anesthesia Assessment:                        - Prior to the procedure, a History and Physical was                         performed, and patient medications, allergies and                         sensitivities were reviewed. The patient's tolerance                         of previous anesthesia was reviewed.                        - The risks and benefits of the procedure and the                         sedation options and risks were discussed with the                         patient. All questions were answered and informed                         consent was obtained.                        After obtaining informed consent, the endoscope was                         passed under direct vision. Throughout the procedure,                         the patient's blood pressure, pulse, and oxygen                         saturations were monitored continuously. The Endoscope                         was introduced through the mouth, and advanced to the                         fourth part of duodenum. The upper GI endoscopy was                         accomplished without difficulty. The patient tolerated  the procedure well. Findings:      A small hiatal hernia was present.      Diffuse mild mucosal variance characterized by discoloration was found       in the prepyloric region of the stomach. Biopsies were taken with a cold       forceps for histology.      The  examined duodenum was normal. Impression:            - Small hiatal hernia.                        - Gastric mucosal variant. Biopsied.                        - Normal examined duodenum. Recommendation:        - To visualize the small bowel, perform video capsule                         endoscopy at appointment to be scheduled. Procedure Code(s):     --- Professional ---                        [id number], Esophagogastroduodenoscopy, flexible,                         transoral; with biopsy, single or multiple Diagnosis Code(s):     --- Professional ---                        K44.9, Diaphragmatic hernia without obstruction or                         gangrene                        K31.89, Other diseases of stomach and duodenum                        K92.1, Melena (includes Hematochezia) CPT copyright 2019 American Medical Association. All rights reserved. The codes documented in this report are preliminary and upon coder review may  be revised to meet current compliance requirements. Robert Bellow, MD 04/28/2019 9:02:16 AM This report has been signed electronically. Number of Addenda: 0 Note Initiated On: 04/28/2019 8:48 AM Estimated Blood Loss:  Estimated blood loss: none.      [facility]

## 2019-04-28 NOTE — Anesthesia Postprocedure Evaluation (Signed)
Anesthesia Post Note  Patient: Andrea Haley  Procedure(s) Performed: ESOPHAGOGASTRODUODENOSCOPY (EGD) WITH PROPOFOL (N/A )  Patient location during evaluation: Endoscopy Anesthesia Type: General Level of consciousness: awake and alert Pain management: pain level controlled Vital Signs Assessment: post-procedure vital signs reviewed and stable Respiratory status: spontaneous breathing, nonlabored ventilation, respiratory function stable and patient connected to nasal cannula oxygen Cardiovascular status: blood pressure returned to baseline and stable Postop Assessment: no apparent nausea or vomiting Anesthetic complications: no     Last Vitals:  Vitals:   04/28/19 0902  BP: (!) 144/50  Pulse: (!) 56  Resp: 12  Temp: 36.6 C  SpO2: 100%    Last Pain:  Vitals:   04/28/19 0932  TempSrc:   PainSc: 0-No pain                 Precious Haws Gwyn Hieronymus

## 2019-04-29 ENCOUNTER — Encounter: Payer: Self-pay | Admitting: *Deleted

## 2019-04-30 ENCOUNTER — Other Ambulatory Visit: Payer: Self-pay | Admitting: General Surgery

## 2019-04-30 DIAGNOSIS — K921 Melena: Secondary | ICD-10-CM

## 2019-04-30 LAB — SURGICAL PATHOLOGY

## 2019-05-03 ENCOUNTER — Other Ambulatory Visit: Payer: Self-pay | Admitting: General Surgery

## 2019-05-04 ENCOUNTER — Other Ambulatory Visit
Admission: RE | Admit: 2019-05-04 | Discharge: 2019-05-04 | Disposition: A | Discharge status: Home / Self Care | Payer: Medicare PPO | Source: Ambulatory Visit | Attending: General Surgery | Admitting: General Surgery

## 2019-05-04 ENCOUNTER — Encounter
Admission: RE | Admit: 2019-05-04 | Discharge: 2019-05-04 | Disposition: A | Discharge status: Home / Self Care | Payer: Medicare PPO | Source: Ambulatory Visit | Attending: General Surgery | Admitting: General Surgery

## 2019-05-04 ENCOUNTER — Other Ambulatory Visit: Payer: Self-pay

## 2019-05-04 DIAGNOSIS — K921 Melena: Secondary | ICD-10-CM | POA: Diagnosis not present

## 2019-05-04 LAB — HEMOGLOBIN AND HEMATOCRIT, BLOOD
HCT: 26 % — ABNORMAL LOW (ref 36.0–46.0)
Hemoglobin: 8.4 g/dL — ABNORMAL LOW (ref 12.0–15.0)

## 2019-05-04 MED ORDER — SODIUM PERTECHNETATE TC 99M INJECTION
9.9800 | Freq: Once | INTRAVENOUS | Status: AC | PRN
  Administered 2019-05-04: 9.98 via INTRAVENOUS

## 2019-05-06 ENCOUNTER — Ambulatory Visit
Admission: RE | Admit: 2019-05-06 | Discharge: 2019-05-06 | Disposition: A | Discharge status: Home / Self Care | Payer: Medicare PPO | Attending: General Surgery | Admitting: General Surgery

## 2019-05-06 ENCOUNTER — Other Ambulatory Visit: Payer: Self-pay

## 2019-05-06 ENCOUNTER — Encounter
Admission: RE | Disposition: A | Discharge status: Home / Self Care | Payer: Self-pay | Source: Home / Self Care | Attending: General Surgery

## 2019-05-06 DIAGNOSIS — E669 Obesity, unspecified: Secondary | ICD-10-CM | POA: Diagnosis not present

## 2019-05-06 DIAGNOSIS — M549 Dorsalgia, unspecified: Secondary | ICD-10-CM | POA: Diagnosis not present

## 2019-05-06 DIAGNOSIS — I2510 Atherosclerotic heart disease of native coronary artery without angina pectoris: Secondary | ICD-10-CM | POA: Insufficient documentation

## 2019-05-06 DIAGNOSIS — I509 Heart failure, unspecified: Secondary | ICD-10-CM | POA: Diagnosis not present

## 2019-05-06 DIAGNOSIS — I110 Hypertensive heart disease with heart failure: Secondary | ICD-10-CM | POA: Diagnosis not present

## 2019-05-06 DIAGNOSIS — K921 Melena: Secondary | ICD-10-CM | POA: Diagnosis present

## 2019-05-06 DIAGNOSIS — K449 Diaphragmatic hernia without obstruction or gangrene: Secondary | ICD-10-CM | POA: Diagnosis not present

## 2019-05-06 DIAGNOSIS — G8929 Other chronic pain: Secondary | ICD-10-CM | POA: Diagnosis not present

## 2019-05-06 DIAGNOSIS — K219 Gastro-esophageal reflux disease without esophagitis: Secondary | ICD-10-CM | POA: Diagnosis not present

## 2019-05-06 DIAGNOSIS — K3189 Other diseases of stomach and duodenum: Secondary | ICD-10-CM | POA: Diagnosis not present

## 2019-05-06 DIAGNOSIS — E785 Hyperlipidemia, unspecified: Secondary | ICD-10-CM | POA: Diagnosis not present

## 2019-05-06 HISTORY — PX: GIVENS CAPSULE STUDY: SHX5432

## 2019-05-06 SURGERY — IMAGING PROCEDURE, GI TRACT, INTRALUMINAL, VIA CAPSULE

## 2019-05-07 ENCOUNTER — Encounter: Payer: Self-pay | Admitting: *Deleted

## 2019-05-08 NOTE — H&P (Signed)
Patient for capsule endoscopy to assess for occult bleeding site not identified on EGD/ Colonoscopy/ Meckel's scan.

## 2019-05-14 ENCOUNTER — Other Ambulatory Visit: Payer: Self-pay

## 2019-05-14 ENCOUNTER — Inpatient Hospital Stay: Payer: Medicare PPO

## 2019-05-14 ENCOUNTER — Encounter: Payer: Self-pay | Admitting: Oncology

## 2019-05-14 ENCOUNTER — Inpatient Hospital Stay: Payer: Medicare PPO | Attending: Oncology | Admitting: Oncology

## 2019-05-14 VITALS — BP 118/58 | HR 53 | Temp 96.0°F | Resp 18 | Wt 175.5 lb

## 2019-05-14 DIAGNOSIS — I509 Heart failure, unspecified: Secondary | ICD-10-CM | POA: Diagnosis not present

## 2019-05-14 DIAGNOSIS — I4891 Unspecified atrial fibrillation: Secondary | ICD-10-CM | POA: Diagnosis not present

## 2019-05-14 DIAGNOSIS — K219 Gastro-esophageal reflux disease without esophagitis: Secondary | ICD-10-CM | POA: Diagnosis not present

## 2019-05-14 DIAGNOSIS — Z791 Long term (current) use of non-steroidal anti-inflammatories (NSAID): Secondary | ICD-10-CM | POA: Insufficient documentation

## 2019-05-14 DIAGNOSIS — I252 Old myocardial infarction: Secondary | ICD-10-CM | POA: Insufficient documentation

## 2019-05-14 DIAGNOSIS — I2510 Atherosclerotic heart disease of native coronary artery without angina pectoris: Secondary | ICD-10-CM | POA: Insufficient documentation

## 2019-05-14 DIAGNOSIS — E079 Disorder of thyroid, unspecified: Secondary | ICD-10-CM | POA: Diagnosis not present

## 2019-05-14 DIAGNOSIS — D649 Anemia, unspecified: Secondary | ICD-10-CM

## 2019-05-14 DIAGNOSIS — N189 Chronic kidney disease, unspecified: Secondary | ICD-10-CM | POA: Diagnosis not present

## 2019-05-14 DIAGNOSIS — Z7901 Long term (current) use of anticoagulants: Secondary | ICD-10-CM | POA: Insufficient documentation

## 2019-05-14 DIAGNOSIS — E785 Hyperlipidemia, unspecified: Secondary | ICD-10-CM | POA: Diagnosis not present

## 2019-05-14 DIAGNOSIS — Z79899 Other long term (current) drug therapy: Secondary | ICD-10-CM | POA: Diagnosis not present

## 2019-05-14 DIAGNOSIS — D509 Iron deficiency anemia, unspecified: Secondary | ICD-10-CM | POA: Diagnosis not present

## 2019-05-14 DIAGNOSIS — I130 Hypertensive heart and chronic kidney disease with heart failure and stage 1 through stage 4 chronic kidney disease, or unspecified chronic kidney disease: Secondary | ICD-10-CM | POA: Diagnosis not present

## 2019-05-14 LAB — CBC
HCT: 27.8 % — ABNORMAL LOW (ref 36.0–46.0)
Hemoglobin: 8.2 g/dL — ABNORMAL LOW (ref 12.0–15.0)
MCH: 26.4 pg (ref 26.0–34.0)
MCHC: 29.5 g/dL — ABNORMAL LOW (ref 30.0–36.0)
MCV: 89.4 fL (ref 80.0–100.0)
Platelets: 250 10*3/uL (ref 150–400)
RBC: 3.11 MIL/uL — ABNORMAL LOW (ref 3.87–5.11)
RDW: 14.4 % (ref 11.5–15.5)
WBC: 4.5 10*3/uL (ref 4.0–10.5)
nRBC: 0 % (ref 0.0–0.2)

## 2019-05-14 LAB — IRON AND TIBC
Iron: 34 ug/dL (ref 28–170)
Saturation Ratios: 7 % — ABNORMAL LOW (ref 10.4–31.8)
TIBC: 493 ug/dL — ABNORMAL HIGH (ref 250–450)
UIBC: 459 ug/dL

## 2019-05-14 LAB — DAT, POLYSPECIFIC AHG (ARMC ONLY)
DAT, IgG: NEGATIVE
DAT, complement: POSITIVE
Polyspecific AHG test: POSITIVE

## 2019-05-14 LAB — SAMPLE TO BLOOD BANK

## 2019-05-14 LAB — RETICULOCYTES
Immature Retic Fract: 16.1 % — ABNORMAL HIGH (ref 2.3–15.9)
RBC.: 3.05 MIL/uL — ABNORMAL LOW (ref 3.87–5.11)
Retic Count, Absolute: 64.4 10*3/uL (ref 19.0–186.0)
Retic Ct Pct: 2.1 % (ref 0.4–3.1)

## 2019-05-14 LAB — VITAMIN B12: Vitamin B-12: 251 pg/mL (ref 180–914)

## 2019-05-14 LAB — FERRITIN: Ferritin: 11 ng/mL (ref 11–307)

## 2019-05-14 LAB — FOLATE: Folate: 14.5 ng/mL (ref >5.9)

## 2019-05-14 LAB — LACTATE DEHYDROGENASE: LDH: 166 U/L (ref 98–192)

## 2019-05-14 NOTE — Progress Notes (Signed)
Andrea Haley  Telephone:([phone] Fax:(336) [phone]  ID: CACHE KNEEBONE OB: Oct 05, 1931  MR#: [id number]  [id number]  Patient Care Team: Idelle Crouch, MD as PCP - General (Internal Medicine)  CHIEF COMPLAINT: Iron deficiency anemia.  INTERVAL HISTORY: Patient is an 86 year old female who was recently noted to have significant drop in her hemoglobin requiring 2 units of blood at [facility].  Patient reportedly has antibodies making her blood difficult to match.  Colonoscopy in September 2020 and upper endoscopy in February 2021 did not reveal any distinct pathology.  Patient also had capsule endoscopy that was incomplete and needs to be repeated in the near future.  She currently feels well and is asymptomatic.  She does not complain of weakness and fatigue.  She has a good appetite and denies weight loss.  She has no neurologic complaints.  She denies any recent fevers or illnesses.  She has no chest pain, shortness of breath, cough, or hemoptysis.  She denies any nausea, vomiting, constipation, or diarrhea.  She has no melena or hematochezia, but admits to dark stools secondary to iron supplementation.  She has no urinary complaints.  Patient offers no specific complaints today.  REVIEW OF SYSTEMS:   Review of Systems  Constitutional: Negative.  Negative for fever, malaise/fatigue and weight loss.  Respiratory: Negative.  Negative for cough and shortness of breath.   Cardiovascular: Negative.  Negative for chest pain and leg swelling.  Gastrointestinal: Negative.  Negative for abdominal pain, blood in stool and melena.  Genitourinary: Negative.  Negative for hematuria.  Musculoskeletal: Negative.  Negative for back pain.  Skin: Negative.  Negative for rash.  Neurological: Negative.  Negative for dizziness, focal weakness, weakness and headaches.  Psychiatric/Behavioral: Negative.  The patient is not nervous/anxious.     As per HPI. Otherwise, a complete  review of systems is negative.  PAST MEDICAL HISTORY: Past Medical History:  Diagnosis Date  . Anemia   . 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 CHF (congestive heart failure) ([place])   . Chronic back pain   . Chronic kidney disease   . Coronary artery disease   . Dysrhythmia    A-fib, ventricular ectopy with palpitations  . Fibrocystic breast disease   . GERD (gastroesophageal reflux disease)   . Heart murmur   . Hyperlipidemia   . Hypertension   . Ischemic colitis ([place])   . Myocardial infarction ([place])   . Obesity   . Peptic ulcer disease   . Renovascular hypertension   . Thyroid disease   . Venous stasis     PAST SURGICAL HISTORY: Past Surgical History:  Procedure Laterality Date  . ABDOMINAL HYSTERECTOMY    . CARDIAC ELECTROPHYSIOLOGY MAPPING AND ABLATION    . COLONOSCOPY WITH PROPOFOL N/A 11/26/2018   Procedure: COLONOSCOPY WITH PROPOFO;  Surgeon: Lollie Sails, MD;  Location: [facility] ENDOSCOPY;  Service: Endoscopy;  Laterality: N/A;  . ESOPHAGOGASTRODUODENOSCOPY (EGD) WITH PROPOFOL N/A 04/28/2019   Procedure: ESOPHAGOGASTRODUODENOSCOPY (EGD) WITH PROPOFOL;  Surgeon: Robert Bellow, MD;  Location: [facility] ENDOSCOPY;  Service: Endoscopy;  Laterality: N/A;  . GIVENS CAPSULE STUDY N/A 05/06/2019   Procedure: GIVENS CAPSULE STUDY;  Surgeon: Robert Bellow, MD;  Location: [facility] ENDOSCOPY;  Service: Endoscopy;  Laterality: N/A;  . HAND DEBRIDEMENT Left 11/2015   Debridement skin/SQ tissue, wrist/hand/finger  . MASTECTOMY Bilateral   . radial artery repair Left 11/2015   aneurysm/pseudoaneurysm repair, graft insertion of radial/ulnar artery    FAMILY HISTORY: History reviewed.  No pertinent family history.  ADVANCED DIRECTIVES (Y/N):  N  HEALTH MAINTENANCE: Social History   Tobacco Use  . Smoking status: Never Smoker  Substance Use Topics  . Alcohol use: Yes    Comment: occasional wine qhs  . Drug use: Never     Colonoscopy:  PAP:  Bone density:  Lipid  panel:  Allergies  Allergen Reactions  . Amlodipine   . Codeine   . Cortisone   . Hydrochlorothiazide Other (See Comments)    Confusion  . Lisinopril   . Phenergan [Promethazine]   . Prednisone   . Spironolactone   . Tikosyn [Dofetilide]     Current Outpatient Medications  Medication Sig Dispense Refill  . acetaminophen (TYLENOL) 500 MG tablet Take 1,000 mg by mouth every 6 (six) hours as needed.    . ALPRAZolam (XANAX) 0.25 MG tablet Take 0.25 mg by mouth 3 (three) times daily as needed for anxiety or sleep.    . carvedilol (COREG) 25 MG tablet Take 25 mg by mouth every 12 (twelve) hours.    . diclofenac sodium (VOLTAREN) 1 % GEL Apply 2 g topically 4 (four) times daily.    Marland Kitchen DIPHENHYDRAMINE-PE-APAP PO Take 1 tablet by mouth at bedtime as needed ("Legatrin PM" OTC. For muscle cramps and sleep PRN).    . ferrous sulfate 325 (65 FE) MG tablet Take 325 mg by mouth daily with breakfast.    . furosemide (LASIX) 40 MG tablet Take 40 mg by mouth 2 (two) times daily.    Marland Kitchen gabapentin (NEURONTIN) 300 MG capsule Take 300 mg by mouth at bedtime.    . hydrALAZINE (APRESOLINE) 50 MG tablet Take 50 mg by mouth 3 (three) times daily.    . lansoprazole (PREVACID) 15 MG capsule Take 15 mg by mouth 2 (two) times daily.    Marland Kitchen levothyroxine (SYNTHROID) 75 MCG tablet Take 75 mcg by mouth daily before breakfast. Take qd on empty stomach with a glass of water at least 30-60 minutes before breakfast    . linaclotide (LINZESS) 290 MCG CAPS capsule Take by mouth.    . losartan (COZAAR) 100 MG tablet Take 100 mg by mouth daily.    . pravastatin (PRAVACHOL) 80 MG tablet Take 80 mg by mouth daily. At night    . triamcinolone cream (KENALOG) 0.5 % Apply 1 application topically 2 (two) times daily.    . verapamil (CALAN-SR) 180 MG CR tablet Take 180 mg by mouth daily.    Marland Kitchen apixaban (ELIQUIS) 5 MG TABS tablet Take 5 mg by mouth every 12 (twelve) hours.    . cloNIDine (CATAPRES) 0.1 MG tablet Take 0.1 mg by mouth 2  (two) times daily. Take 2 tablets with breakfast and lunch, 3 tablets at bedtime    . metoCLOPramide (REGLAN) 5 MG tablet Take 5 mg by mouth 3 (three) times daily before meals. For 30 days    . senna (SENOKOT) 8.6 MG tablet Take 1 tablet by mouth daily.     No current facility-administered medications for this visit.    OBJECTIVE: Vitals:   05/14/19 1000  BP: (!) 118/58  Pulse: (!) 53  Resp: 18  Temp: (!) 96 F (35.6 C)  SpO2: 100%     Body mass index is 28.33 kg/m.    ECOG FS:0 - Asymptomatic  General: Well-developed, well-nourished, no acute distress. Eyes: Pink conjunctiva, anicteric sclera. HEENT: Normocephalic, moist mucous membranes. Lungs: No audible wheezing or coughing. Heart: Regular rate and rhythm. Abdomen: Soft, nontender, no obvious  distention. Musculoskeletal: No edema, cyanosis, or clubbing. Neuro: Alert, answering all questions appropriately. Cranial nerves grossly intact. Skin: No rashes or petechiae noted. Psych: Normal affect. Lymphatics: No cervical, calvicular, axillary or inguinal LAD.   LAB RESULTS:  Lab Results  Component Value Date   NA 135 (L) 03/31/2013   K 4.7 03/31/2013   CL 104 03/31/2013   CO2 25 03/31/2013   GLUCOSE 115 (H) 03/31/2013   BUN 22 (H) 03/31/2013   CREATININE 0.94 03/31/2013   CALCIUM 7.9 (L) 03/31/2013   PROT 6.1 (L) 03/31/2013   ALBUMIN 3.4 03/31/2013   AST 33 03/31/2013   ALT 26 03/31/2013   ALKPHOS 68 03/31/2013   BILITOT 0.6 03/31/2013   GFRNONAA 58 (L) 03/31/2013   GFRAA >60 03/31/2013    Lab Results  Component Value Date   WBC 4.5 05/14/2019   HGB 8.2 (L) 05/14/2019   HCT 27.8 (L) 05/14/2019   MCV 89.4 05/14/2019   PLT 250 05/14/2019   Lab Results  Component Value Date   IRON 34 05/14/2019   TIBC 493 (H) 05/14/2019   IRONPCTSAT 7 (L) 05/14/2019   Lab Results  Component Value Date   FERRITIN 11 05/14/2019     STUDIES: NM Bowel Img Meckels  Result Date: 05/04/2019 CLINICAL DATA:   Gastrointestinal bleeding. Melena. Evaluate for Meckel's diverticulum EXAM: NUCLEAR MEDICINE MECKELS SCAN TECHNIQUE: Sequential abdominal images were obtained following intravenous injection of radiopharmaceutical. RADIOPHARMACEUTICALS:  [id number] millicuries technetium pertechnetate COMPARISON:  CT abdomen 11/11/2018 FINDINGS: No abnormal radiotracer accumulation within the abdomen pelvis to localize ectopic gastric mucosa (Meckel's diverticulum). Physiologic activity noted within the stomach. IMPRESSION: No scintigraphic evidence of ectopic gastric mucosa (Meckel's diverticulum). Electronically Signed   By: Suzy Bouchard M.D.   On: 05/04/2019 15:10    ASSESSMENT: Iron deficiency anemia.  PLAN:    1.  Iron deficiency anemia: Patient's hemoglobin iron stores are significantly reduced.  She reportedly has antibodies making her blood difficult to match.  Patient recently received 2 units of red blood cells at [facility].  Colonoscopy in September 2020 and upper endoscopy in February 2021 did not reveal any distinct pathology.  Patient also had capsule endoscopy that was incomplete and needs to be repeated in the near future.  Her folate levels are within normal limits and she has no evidence of hemolysis.  In order to minimize blood transfusions, patient will return to clinic in 1 and 2 weeks to receive 510 mg IV Feraheme.  She would then return to clinic in 2 months with repeat laboratory work and further evaluation.  I spent a total of 45 minutes reviewing chart data, face-to-face evaluation with the patient, counseling and coordination of care as detailed above.  Patient expressed understanding and was in agreement with this plan. She also understands that She can call clinic at any time with any questions, concerns, or complaints.   Cancer Staging No matching staging information was found for the patient.  Lloyd Huger, MD   05/14/2019 5:01 PM

## 2019-05-15 LAB — HAPTOGLOBIN: Haptoglobin: 189 mg/dL (ref 41–333)

## 2019-05-15 LAB — ERYTHROPOIETIN: Erythropoietin: 44.4 m[IU]/mL — ABNORMAL HIGH (ref 2.6–18.5)

## 2019-05-21 ENCOUNTER — Other Ambulatory Visit: Payer: Self-pay

## 2019-05-21 ENCOUNTER — Inpatient Hospital Stay: Payer: Medicare PPO | Attending: Oncology

## 2019-05-21 VITALS — BP 218/68 | HR 66 | Temp 97.6°F | Resp 17

## 2019-05-21 DIAGNOSIS — D509 Iron deficiency anemia, unspecified: Secondary | ICD-10-CM | POA: Diagnosis not present

## 2019-05-21 DIAGNOSIS — D649 Anemia, unspecified: Secondary | ICD-10-CM

## 2019-05-21 MED ORDER — SODIUM CHLORIDE 0.9 % IV SOLN
Freq: Once | INTRAVENOUS | Status: AC
  Filled 2019-05-21: qty 250

## 2019-05-21 MED ORDER — SODIUM CHLORIDE 0.9 % IV SOLN
510.0000 mg | Freq: Once | INTRAVENOUS | Status: AC
  Administered 2019-05-21: 510 mg via INTRAVENOUS
  Filled 2019-05-21: qty 510

## 2019-05-21 NOTE — Progress Notes (Signed)
1400: 197/63 1425: 199/57 1445: 194/58 Pt denies any concerns, no s/s of distress noted. Pt states she was late taking her lunch time b/p medications (which she took at approx 1405) Pt reports that she is nervous about first time feraheme infusion. Dr. Grayland Ormond aware. Per Dr. Grayland Ormond okay to proceed with feraheme as scheduled.  Pt educated to monitor b/p at home, if b/p remains elevated or increases contact PCP, if pt develops s/s of hypertension, pt to report to ER/Call 911. Pt verbalizes understanding.   1532: Pt did reports being "chilly" after the Feraheme was complete, pt given a warm blanket and pt reports that the warm blanket resolved the cold feeling, pt denies chills, no s/s of distress noted. 1557: Pt reports a metallic taste in mouth, pt denies any other symptoms at this time. No s/s of distress noted.  1607: MD aware of B/P throughout treatment and patients complaints throughout treatment. Per MD okay to discharge pt home, no further orders at this time.  1610: Pt educated on the importance of monitoring B/P closely and to seek medical help (call 911/report to ER) if b/p remains elevated or continues to increase or develops s/s of hypertension. (s/s reviewed with pt). Pt verbalizes understanding. No s/s of distress noted. Pt stable at discharge.

## 2019-05-24 ENCOUNTER — Encounter: Payer: Self-pay | Admitting: General Surgery

## 2019-05-24 NOTE — Care Management Note (Signed)
Given's capsule endoscopy had been denied by [employer]. A peer-2-peer contact had been request on 1 day notice when I was out of town that could not be met. I have filled an expedited appeal to [employer]. The code listed by [employer] for the procedure was 19111.  This is incorrect. The correct CPT code is 19110.  The patient had an incomplete study, and is reluctant to consider a repeat due to cost. Presently on 1/2 standard dose Eliquis for her atrial fibrillation.  Her daughter Ms. Holt has been in contact with the patient's cardiologist at [facility], Dr. Blazing, who would prefer she was on full dose Eliquis within the next few weeks.  Family will be contacted when [employer] corrects their records.  

## 2019-05-28 ENCOUNTER — Inpatient Hospital Stay: Payer: Medicare PPO

## 2019-05-28 VITALS — BP 160/64 | HR 60 | Temp 97.4°F | Resp 18

## 2019-05-28 DIAGNOSIS — D509 Iron deficiency anemia, unspecified: Secondary | ICD-10-CM | POA: Diagnosis not present

## 2019-05-28 DIAGNOSIS — D649 Anemia, unspecified: Secondary | ICD-10-CM

## 2019-05-28 MED ORDER — SODIUM CHLORIDE 0.9 % IV SOLN
510.0000 mg | Freq: Once | INTRAVENOUS | Status: AC
  Administered 2019-05-28: 510 mg via INTRAVENOUS
  Filled 2019-05-28: qty 510

## 2019-05-28 MED ORDER — SODIUM CHLORIDE 0.9 % IV SOLN
Freq: Once | INTRAVENOUS | Status: AC
  Filled 2019-05-28: qty 250

## 2019-05-28 NOTE — Progress Notes (Signed)
Patient here for for iron infusion today.  She brings in a written note from her daughter stated that she has started Prazocin 2mg  QHS, prescribed by her cardiologist.  Will add the new med to her list.

## 2019-05-28 NOTE — Progress Notes (Signed)
Blood Pressure: 175/69. Patient reports this blood pressure reading is very good for her. MD, Dr. Grayland Ormond, notified. Per MD order: proceed with scheduled Feraheme treatment today.

## 2019-06-21 ENCOUNTER — Other Ambulatory Visit: Payer: Self-pay

## 2019-06-21 ENCOUNTER — Telehealth: Payer: Self-pay | Admitting: *Deleted

## 2019-06-21 ENCOUNTER — Inpatient Hospital Stay: Payer: Medicare PPO | Attending: Oncology

## 2019-06-21 DIAGNOSIS — Z79899 Other long term (current) drug therapy: Secondary | ICD-10-CM | POA: Diagnosis not present

## 2019-06-21 DIAGNOSIS — I252 Old myocardial infarction: Secondary | ICD-10-CM | POA: Insufficient documentation

## 2019-06-21 DIAGNOSIS — I509 Heart failure, unspecified: Secondary | ICD-10-CM | POA: Insufficient documentation

## 2019-06-21 DIAGNOSIS — Z7901 Long term (current) use of anticoagulants: Secondary | ICD-10-CM | POA: Diagnosis not present

## 2019-06-21 DIAGNOSIS — D509 Iron deficiency anemia, unspecified: Secondary | ICD-10-CM | POA: Insufficient documentation

## 2019-06-21 DIAGNOSIS — D649 Anemia, unspecified: Secondary | ICD-10-CM

## 2019-06-21 DIAGNOSIS — E785 Hyperlipidemia, unspecified: Secondary | ICD-10-CM | POA: Diagnosis not present

## 2019-06-21 DIAGNOSIS — Z791 Long term (current) use of non-steroidal anti-inflammatories (NSAID): Secondary | ICD-10-CM | POA: Diagnosis not present

## 2019-06-21 DIAGNOSIS — K219 Gastro-esophageal reflux disease without esophagitis: Secondary | ICD-10-CM | POA: Insufficient documentation

## 2019-06-21 DIAGNOSIS — I130 Hypertensive heart and chronic kidney disease with heart failure and stage 1 through stage 4 chronic kidney disease, or unspecified chronic kidney disease: Secondary | ICD-10-CM | POA: Diagnosis not present

## 2019-06-21 DIAGNOSIS — I2510 Atherosclerotic heart disease of native coronary artery without angina pectoris: Secondary | ICD-10-CM | POA: Insufficient documentation

## 2019-06-21 DIAGNOSIS — I4891 Unspecified atrial fibrillation: Secondary | ICD-10-CM | POA: Insufficient documentation

## 2019-06-21 LAB — CBC WITH DIFFERENTIAL/PLATELET
Abs Immature Granulocytes: 0.02 10*3/uL (ref 0.00–0.07)
Basophils Absolute: 0 10*3/uL (ref 0.0–0.1)
Basophils Relative: 1 %
Eosinophils Absolute: 0.1 10*3/uL (ref 0.0–0.5)
Eosinophils Relative: 2 %
HCT: 28.5 % — ABNORMAL LOW (ref 36.0–46.0)
Hemoglobin: 9.6 g/dL — ABNORMAL LOW (ref 12.0–15.0)
Immature Granulocytes: 0 %
Lymphocytes Relative: 17 %
Lymphs Abs: 0.9 10*3/uL (ref 0.7–4.0)
MCH: 28.6 pg (ref 26.0–34.0)
MCHC: 33.7 g/dL (ref 30.0–36.0)
MCV: 84.8 fL (ref 80.0–100.0)
Monocytes Absolute: 0.6 10*3/uL (ref 0.1–1.0)
Monocytes Relative: 11 %
Neutro Abs: 3.5 10*3/uL (ref 1.7–7.7)
Neutrophils Relative %: 69 %
Platelets: 186 10*3/uL (ref 150–400)
RBC: 3.36 MIL/uL — ABNORMAL LOW (ref 3.87–5.11)
RDW: 17.3 % — ABNORMAL HIGH (ref 11.5–15.5)
WBC: 5.1 10*3/uL (ref 4.0–10.5)
nRBC: 0 % (ref 0.0–0.2)

## 2019-06-21 LAB — IRON AND TIBC
Iron: 96 ug/dL (ref 28–170)
Saturation Ratios: 33 % — ABNORMAL HIGH (ref 10.4–31.8)
TIBC: 293 ug/dL (ref 250–450)
UIBC: 197 ug/dL

## 2019-06-21 LAB — FERRITIN: Ferritin: 264 ng/mL (ref 11–307)

## 2019-06-21 NOTE — Telephone Encounter (Signed)
Not from iron def or her infusion.  She is likely still low, but improved from previous.  Having said that, they can come in for lab only if desired.

## 2019-06-21 NOTE — Telephone Encounter (Signed)
Desire appointment for today, accepts appointment for 245 PM. Also advised that she contact the PCP regarding the rash and she is agreeable to this

## 2019-06-21 NOTE — Telephone Encounter (Signed)
Daughter called reporting that patient has a rash that started around her mouth and cheeks last Wednesday and now it is on her palms and back. She also reports that patient is fatigued. She had her last iron inf on 05/28/19 and her last HGB on 3/19 was 10.1. She states at first they thought the rash was coming form wearing a mask until it spread and then she looked up symptoms of iron deficiency and the rash is a symptoms of low iron. She is asking for the patient to be checked. Please advise

## 2019-06-22 NOTE — Telephone Encounter (Signed)
Called pt's daughter to let her know iron level within normal limits and that hgb is higher than it was last time we checked it here. Daughter was happy to hear that and stated that they would contact pt's PCP regarding the rash.

## 2019-07-09 ENCOUNTER — Other Ambulatory Visit: Payer: Self-pay | Admitting: Emergency Medicine

## 2019-07-09 ENCOUNTER — Other Ambulatory Visit: Payer: Self-pay

## 2019-07-09 ENCOUNTER — Encounter: Payer: Self-pay | Admitting: Oncology

## 2019-07-09 DIAGNOSIS — D649 Anemia, unspecified: Secondary | ICD-10-CM

## 2019-07-09 NOTE — Progress Notes (Signed)
Patient denies any pain or concerns at this time.  

## 2019-07-10 NOTE — Progress Notes (Signed)
[place]  Telephone:(336) [phone] Fax:(336) [phone]  ID: Andrea Haley OB: [birth year]  MR#: [id number]  [id number]  Patient Care Team: Idelle Crouch, MD as PCP - General (Internal Medicine)  CHIEF COMPLAINT: Iron deficiency anemia.  INTERVAL HISTORY: Patient returns to clinic today for repeat laboratory work, further evaluation, consideration of additional IV iron.  She currently feels well and is asymptomatic.  She does not complain of any weakness or fatigue. She has a good appetite and denies weight loss.  She has no neurologic complaints.  She denies any recent fevers or illnesses.  She has no chest pain, shortness of breath, cough, or hemoptysis.  She denies any nausea, vomiting, constipation, or diarrhea.  She has no melena or hematochezia, but admits to dark stools secondary to iron supplementation.  She has no urinary complaints.  Patient feels at her baseline offers no specific complaints today.  REVIEW OF SYSTEMS:   Review of Systems  Constitutional: Negative.  Negative for fever, malaise/fatigue and weight loss.  Respiratory: Negative.  Negative for cough and shortness of breath.   Cardiovascular: Negative.  Negative for chest pain and leg swelling.  Gastrointestinal: Negative.  Negative for abdominal pain, blood in stool and melena.  Genitourinary: Negative.  Negative for hematuria.  Musculoskeletal: Negative.  Negative for back pain.  Skin: Negative.  Negative for rash.  Neurological: Negative.  Negative for dizziness, focal weakness, weakness and headaches.  Psychiatric/Behavioral: Negative.  The patient is not nervous/anxious.     As per HPI. Otherwise, a complete review of systems is negative.  PAST MEDICAL HISTORY: Past Medical History:  Diagnosis Date  . Anemia   . 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 CHF (congestive heart failure) ([place])   . Chronic back pain   . Chronic kidney disease   . Coronary artery disease   .  Dysrhythmia    A-fib, ventricular ectopy with palpitations  . Fibrocystic breast disease   . GERD (gastroesophageal reflux disease)   . Heart murmur   . Hyperlipidemia   . Hypertension   . Ischemic colitis ([place])   . Myocardial infarction ([place])   . Obesity   . Peptic ulcer disease   . Renovascular hypertension   . Thyroid disease   . Venous stasis     PAST SURGICAL HISTORY: Past Surgical History:  Procedure Laterality Date  . ABDOMINAL HYSTERECTOMY    . CARDIAC ELECTROPHYSIOLOGY MAPPING AND ABLATION    . COLONOSCOPY WITH PROPOFOL N/A 11/26/2018   Procedure: COLONOSCOPY WITH PROPOFO;  Surgeon: Lollie Sails, MD;  Location: [facility] ENDOSCOPY;  Service: Endoscopy;  Laterality: N/A;  . ESOPHAGOGASTRODUODENOSCOPY (EGD) WITH PROPOFOL N/A 04/28/2019   Procedure: ESOPHAGOGASTRODUODENOSCOPY (EGD) WITH PROPOFOL;  Surgeon: Robert Bellow, MD;  Location: [facility] ENDOSCOPY;  Service: Endoscopy;  Laterality: N/A;  . GIVENS CAPSULE STUDY N/A 05/06/2019   Procedure: GIVENS CAPSULE STUDY;  Surgeon: Robert Bellow, MD;  Location: [facility] ENDOSCOPY;  Service: Endoscopy;  Laterality: N/A;  . HAND DEBRIDEMENT Left 11/2015   Debridement skin/SQ tissue, wrist/hand/finger  . MASTECTOMY Bilateral   . radial artery repair Left 11/2015   aneurysm/pseudoaneurysm repair, graft insertion of radial/ulnar artery    FAMILY HISTORY: History reviewed. No pertinent family history.  ADVANCED DIRECTIVES (Y/N):  N  HEALTH MAINTENANCE: Social History   Tobacco Use  . Smoking status: Never Smoker  . Smokeless tobacco: Never Used  Substance Use Topics  . Alcohol use: Yes    Comment: occasional wine qhs  . Drug  use: Never     Colonoscopy:  PAP:  Bone density:  Lipid panel:  Allergies  Allergen Reactions  . Amlodipine   . Codeine   . Cortisone   . Hydrochlorothiazide Other (See Comments)    Confusion  . Lisinopril   . Phenergan [Promethazine]   . Prednisone   . Spironolactone   . Tikosyn  [Dofetilide]     Current Outpatient Medications  Medication Sig Dispense Refill  . acetaminophen (TYLENOL) 500 MG tablet Take 1,000 mg by mouth every 6 (six) hours as needed.    . ALPRAZolam (XANAX) 0.25 MG tablet Take 0.25 mg by mouth 3 (three) times daily as needed for anxiety or sleep.    Marland Kitchen apixaban (ELIQUIS) 5 MG TABS tablet Take 5 mg by mouth every 12 (twelve) hours.    . carvedilol (COREG) 25 MG tablet Take 25 mg by mouth every 12 (twelve) hours.    . cloNIDine (CATAPRES) 0.1 MG tablet Take 0.1 mg by mouth 2 (two) times daily. Take 2 tablets with breakfast and lunch, 3 tablets at bedtime    . diclofenac sodium (VOLTAREN) 1 % GEL Apply 2 g topically 4 (four) times daily.    Marland Kitchen DIPHENHYDRAMINE-PE-APAP PO Take 1 tablet by mouth at bedtime as needed ("Legatrin PM" OTC. For muscle cramps and sleep PRN).    . ferrous sulfate 325 (65 FE) MG tablet Take 325 mg by mouth daily with breakfast.    . furosemide (LASIX) 40 MG tablet Take 40 mg by mouth 2 (two) times daily.    Marland Kitchen gabapentin (NEURONTIN) 300 MG capsule Take 300 mg by mouth at bedtime.    . hydrALAZINE (APRESOLINE) 50 MG tablet Take 50 mg by mouth 3 (three) times daily.    . lansoprazole (PREVACID) 15 MG capsule Take 15 mg by mouth 2 (two) times daily.    Marland Kitchen levothyroxine (SYNTHROID) 75 MCG tablet Take 75 mcg by mouth daily before breakfast. Take qd on empty stomach with a glass of water at least 30-60 minutes before breakfast    . linaclotide (LINZESS) 290 MCG CAPS capsule Take by mouth.    . losartan (COZAAR) 100 MG tablet Take 100 mg by mouth daily.    . metoCLOPramide (REGLAN) 5 MG tablet Take 5 mg by mouth 3 (three) times daily before meals. For 30 days    . pravastatin (PRAVACHOL) 80 MG tablet Take 80 mg by mouth daily. At night    . prazosin (MINIPRESS) 1 MG capsule     . senna (SENOKOT) 8.6 MG tablet Take 1 tablet by mouth daily.    Marland Kitchen triamcinolone cream (KENALOG) 0.5 % Apply 1 application topically 2 (two) times daily.    .  verapamil (CALAN-SR) 180 MG CR tablet Take 180 mg by mouth daily.     No current facility-administered medications for this visit.    OBJECTIVE: Vitals:   07/12/19 1320  BP: (!) 186/59  Pulse: 64  Resp: 20  Temp: (!) 96.4 F (35.8 C)  SpO2: 98%     Body mass index is 28.55 kg/m.    ECOG FS:0 - Asymptomatic  General: Well-developed, well-nourished, no acute distress. Eyes: Pink conjunctiva, anicteric sclera. HEENT: Normocephalic, moist mucous membranes. Lungs: No audible wheezing or coughing. Heart: Regular rate and rhythm. Abdomen: Soft, nontender, no obvious distention. Musculoskeletal: No edema, cyanosis, or clubbing. Neuro: Alert, answering all questions appropriately. Cranial nerves grossly intact. Skin: No rashes or petechiae noted. Psych: Normal affect.   LAB RESULTS:  Lab Results  Component  Value Date   NA 135 (L) 03/31/2013   K 4.7 03/31/2013   CL 104 03/31/2013   CO2 25 03/31/2013   GLUCOSE 115 (H) 03/31/2013   BUN 22 (H) 03/31/2013   CREATININE 0.94 03/31/2013   CALCIUM 7.9 (L) 03/31/2013   PROT 6.1 (L) 03/31/2013   ALBUMIN 3.4 03/31/2013   AST 33 03/31/2013   ALT 26 03/31/2013   ALKPHOS 68 03/31/2013   BILITOT 0.6 03/31/2013   GFRNONAA 58 (L) 03/31/2013   GFRAA >60 03/31/2013    Lab Results  Component Value Date   WBC 6.0 07/12/2019   NEUTROABS 4.1 07/12/2019   HGB 11.1 (L) 07/12/2019   HCT 33.5 (L) 07/12/2019   MCV 88.6 07/12/2019   PLT 247 07/12/2019   Lab Results  Component Value Date   IRON 119 07/12/2019   TIBC 315 07/12/2019   IRONPCTSAT 38 (H) 07/12/2019   Lab Results  Component Value Date   FERRITIN 146 07/12/2019     STUDIES: No results found.  ASSESSMENT: Iron deficiency anemia.  PLAN:    1.  Iron deficiency anemia: Patient's hemoglobin has significantly improved to 11.1.  Her iron stores are now within normal limits.  She reportedly has antibodies making her blood difficult to match, therefore will try to avoid  transfusions in the future. Colonoscopy in September 2020 and upper endoscopy in February 2021 did not reveal any distinct pathology.  Patient also had capsule endoscopy that was incomplete and needs to be repeated in the near future.  Previously, the remainder of blood work was either negative or within normal limits.  She does not require additional IV Feraheme today.  Patient last received treatment on May 28, 2019.  Return to clinic in 3 months with repeat laboratory work, further evaluation, and continuation of IV Feraheme if needed.  I spent a total of 20 minutes reviewing chart data, face-to-face evaluation with the patient, counseling and coordination of care as detailed above.   Patient expressed understanding and was in agreement with this plan. She also understands that She can call clinic at any time with any questions, concerns, or complaints.    Lloyd Huger, MD   07/12/2019 2:44 PM

## 2019-07-12 ENCOUNTER — Other Ambulatory Visit: Payer: Self-pay

## 2019-07-12 ENCOUNTER — Inpatient Hospital Stay: Payer: Medicare PPO

## 2019-07-12 ENCOUNTER — Encounter: Payer: Self-pay | Admitting: Oncology

## 2019-07-12 ENCOUNTER — Inpatient Hospital Stay (HOSPITAL_BASED_OUTPATIENT_CLINIC_OR_DEPARTMENT_OTHER): Payer: Medicare PPO | Admitting: Oncology

## 2019-07-12 VITALS — BP 186/59 | HR 64 | Temp 96.4°F | Resp 20 | Wt 176.9 lb

## 2019-07-12 DIAGNOSIS — D649 Anemia, unspecified: Secondary | ICD-10-CM

## 2019-07-12 DIAGNOSIS — D509 Iron deficiency anemia, unspecified: Secondary | ICD-10-CM | POA: Diagnosis not present

## 2019-07-12 LAB — IRON AND TIBC
Iron: 119 ug/dL (ref 28–170)
Saturation Ratios: 38 % — ABNORMAL HIGH (ref 10.4–31.8)
TIBC: 315 ug/dL (ref 250–450)
UIBC: 196 ug/dL

## 2019-07-12 LAB — CBC WITH DIFFERENTIAL/PLATELET
Abs Immature Granulocytes: 0.02 10*3/uL (ref 0.00–0.07)
Basophils Absolute: 0.1 10*3/uL (ref 0.0–0.1)
Basophils Relative: 1 %
Eosinophils Absolute: 0.2 10*3/uL (ref 0.0–0.5)
Eosinophils Relative: 3 %
HCT: 33.5 % — ABNORMAL LOW (ref 36.0–46.0)
Hemoglobin: 11.1 g/dL — ABNORMAL LOW (ref 12.0–15.0)
Immature Granulocytes: 0 %
Lymphocytes Relative: 17 %
Lymphs Abs: 1 10*3/uL (ref 0.7–4.0)
MCH: 29.4 pg (ref 26.0–34.0)
MCHC: 33.1 g/dL (ref 30.0–36.0)
MCV: 88.6 fL (ref 80.0–100.0)
Monocytes Absolute: 0.6 10*3/uL (ref 0.1–1.0)
Monocytes Relative: 10 %
Neutro Abs: 4.1 10*3/uL (ref 1.7–7.7)
Neutrophils Relative %: 69 %
Platelets: 247 10*3/uL (ref 150–400)
RBC: 3.78 MIL/uL — ABNORMAL LOW (ref 3.87–5.11)
RDW: 17.5 % — ABNORMAL HIGH (ref 11.5–15.5)
WBC: 6 10*3/uL (ref 4.0–10.5)
nRBC: 0 % (ref 0.0–0.2)

## 2019-07-12 LAB — FERRITIN: Ferritin: 146 ng/mL (ref 11–307)

## 2019-07-22 ENCOUNTER — Encounter: Payer: Self-pay | Admitting: Oncology

## 2019-10-17 ENCOUNTER — Other Ambulatory Visit: Payer: Self-pay | Admitting: Oncology

## 2019-10-17 NOTE — Progress Notes (Signed)
[place]  [phone]) [phone] Fax:(336) [phone]  ID: ELEXIA FRIEDT OB: 1931/05/14  MR#: [id number]  LEX#:[id number]  Patient Care Team: Idelle Crouch, MD as PCP - General (Internal Medicine)  CHIEF COMPLAINT: Iron deficiency anemia.  INTERVAL HISTORY: Patient returns to clinic today for repeat laboratory work, further evaluation, and continuation of IV Feraheme.  She currently feels well and is asymptomatic.  She does not complain of any weakness or fatigue. She has a good appetite and denies weight loss.  She has no neurologic complaints.  She denies any recent fevers or illnesses.  She has no chest pain, shortness of breath, cough, or hemoptysis.  She denies any nausea, vomiting, constipation, or diarrhea.  She has no melena or hematochezia, but admits to dark stools secondary to iron supplementation.  She has no urinary complaints.  Patient offers no specific complaints today.  REVIEW OF SYSTEMS:   Review of Systems  Constitutional: Negative.  Negative for fever, malaise/fatigue and weight loss.  Respiratory: Negative.  Negative for cough and shortness of breath.   Cardiovascular: Negative.  Negative for chest pain and leg swelling.  Gastrointestinal: Negative.  Negative for abdominal pain, blood in stool and melena.  Genitourinary: Negative.  Negative for hematuria.  Musculoskeletal: Negative.  Negative for back pain.  Skin: Negative.  Negative for rash.  Neurological: Negative.  Negative for dizziness, focal weakness, weakness and headaches.  Psychiatric/Behavioral: Negative.  The patient is not nervous/anxious.     As per HPI. Otherwise, a complete review of systems is negative.  PAST MEDICAL HISTORY: Past Medical History:  Diagnosis Date  . Anemia   . 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 CHF (congestive heart failure) ([place])   . Chronic back pain   . Chronic kidney disease   . Coronary artery disease   . Dysrhythmia    A-fib, ventricular  ectopy with palpitations  . Fibrocystic breast disease   . GERD (gastroesophageal reflux disease)   . Heart murmur   . Hyperlipidemia   . Hypertension   . Ischemic colitis ([place])   . Myocardial infarction ([place])   . Obesity   . Peptic ulcer disease   . Renovascular hypertension   . Thyroid disease   . Venous stasis     PAST SURGICAL HISTORY: Past Surgical History:  Procedure Laterality Date  . ABDOMINAL HYSTERECTOMY    . CARDIAC ELECTROPHYSIOLOGY MAPPING AND ABLATION    . COLONOSCOPY WITH PROPOFOL N/A 11/26/2018   Procedure: COLONOSCOPY WITH PROPOFO;  Surgeon: Lollie Sails, MD;  Location: [facility] ENDOSCOPY;  Service: Endoscopy;  Laterality: N/A;  . ESOPHAGOGASTRODUODENOSCOPY (EGD) WITH PROPOFOL N/A 04/28/2019   Procedure: ESOPHAGOGASTRODUODENOSCOPY (EGD) WITH PROPOFOL;  Surgeon: Robert Bellow, MD;  Location: [facility] ENDOSCOPY;  Service: Endoscopy;  Laterality: N/A;  . GIVENS CAPSULE STUDY N/A 05/06/2019   Procedure: GIVENS CAPSULE STUDY;  Surgeon: Robert Bellow, MD;  Location: [facility] ENDOSCOPY;  Service: Endoscopy;  Laterality: N/A;  . HAND DEBRIDEMENT Left 11/2015   Debridement skin/SQ tissue, wrist/hand/finger  . MASTECTOMY Bilateral   . radial artery repair Left 11/2015   aneurysm/pseudoaneurysm repair, graft insertion of radial/ulnar artery    FAMILY HISTORY: No family history on file.  ADVANCED DIRECTIVES (Y/N):  N  HEALTH MAINTENANCE: Social History   Tobacco Use  . Smoking status: Never Smoker  . Smokeless tobacco: Never Used  Vaping Use  . Vaping Use: Never used  Substance Use Topics  . Alcohol use: Yes    Comment: occasional wine  qhs  . Drug use: Never     Colonoscopy:  PAP:  Bone density:  Lipid panel:  Allergies  Allergen Reactions  . Amlodipine   . Codeine   . Cortisone   . Hydrochlorothiazide Other (See Comments)    Confusion  . Lisinopril   . Phenergan [Promethazine]   . Prednisone   . Spironolactone   . Tikosyn [Dofetilide]      Current Outpatient Medications  Medication Sig Dispense Refill  . acetaminophen (TYLENOL) 500 MG tablet Take 1,000 mg by mouth every 6 (six) hours as needed.    . ALPRAZolam (XANAX) 0.25 MG tablet Take 0.25 mg by mouth 3 (three) times daily as needed for anxiety or sleep.    Marland Kitchen apixaban (ELIQUIS) 5 MG TABS tablet Take 5 mg by mouth every 12 (twelve) hours.    . carvedilol (COREG) 25 MG tablet Take 25 mg by mouth every 12 (twelve) hours.    . cloNIDine (CATAPRES) 0.1 MG tablet Take 0.1 mg by mouth 2 (two) times daily. Take 2 tablets with breakfast and lunch, 3 tablets at bedtime    . diclofenac sodium (VOLTAREN) 1 % GEL Apply 2 g topically 4 (four) times daily.    Marland Kitchen DIPHENHYDRAMINE-PE-APAP PO Take 1 tablet by mouth at bedtime as needed ("Legatrin PM" OTC. For muscle cramps and sleep PRN).    . ferrous sulfate 325 (65 FE) MG tablet Take 325 mg by mouth daily with breakfast.    . furosemide (LASIX) 40 MG tablet Take 40 mg by mouth 2 (two) times daily.    Marland Kitchen gabapentin (NEURONTIN) 300 MG capsule Take 300 mg by mouth at bedtime.    . hydrALAZINE (APRESOLINE) 50 MG tablet Take 50 mg by mouth 3 (three) times daily.    . lansoprazole (PREVACID) 15 MG capsule Take 15 mg by mouth 2 (two) times daily.    Marland Kitchen levothyroxine (SYNTHROID) 75 MCG tablet Take 75 mcg by mouth daily before breakfast. Take qd on empty stomach with a glass of water at least 30-60 minutes before breakfast    . linaclotide (LINZESS) 290 MCG CAPS capsule Take by mouth.    . losartan (COZAAR) 100 MG tablet Take 100 mg by mouth daily.    . metoCLOPramide (REGLAN) 5 MG tablet Take 5 mg by mouth 3 (three) times daily before meals. For 30 days    . pravastatin (PRAVACHOL) 80 MG tablet Take 80 mg by mouth daily. At night    . prazosin (MINIPRESS) 1 MG capsule     . senna (SENOKOT) 8.6 MG tablet Take 1 tablet by mouth daily.    Marland Kitchen triamcinolone cream (KENALOG) 0.5 % Apply 1 application topically 2 (two) times daily.    . verapamil (CALAN-SR)  180 MG CR tablet Take 180 mg by mouth daily.     No current facility-administered medications for this visit.    OBJECTIVE: Vitals:   10/18/19 1313  BP: (!) 161/62  Pulse: (!) 59  Resp: 18  Temp: (!) 96.4 F (35.8 C)  SpO2: 99%     Body mass index is 28.55 kg/m.    ECOG FS:0 - Asymptomatic  General: Well-developed, well-nourished, no acute distress. Eyes: Pink conjunctiva, anicteric sclera. HEENT: Normocephalic, moist mucous membranes. Lungs: No audible wheezing or coughing. Heart: Regular rate and rhythm. Abdomen: Soft, nontender, no obvious distention. Musculoskeletal: No edema, cyanosis, or clubbing. Neuro: Alert, answering all questions appropriately. Cranial nerves grossly intact. Skin: No rashes or petechiae noted. Psych: Normal affect.   LAB RESULTS:  Lab Results  Component Value Date   NA 135 (L) 03/31/2013   K 4.7 03/31/2013   CL 104 03/31/2013   CO2 25 03/31/2013   GLUCOSE 115 (H) 03/31/2013   BUN 22 (H) 03/31/2013   CREATININE 0.94 03/31/2013   CALCIUM 7.9 (L) 03/31/2013   PROT 6.1 (L) 03/31/2013   ALBUMIN 3.4 03/31/2013   AST 33 03/31/2013   ALT 26 03/31/2013   ALKPHOS 68 03/31/2013   BILITOT 0.6 03/31/2013   GFRNONAA 58 (L) 03/31/2013   GFRAA >60 03/31/2013    Lab Results  Component Value Date   WBC 5.8 10/18/2019   NEUTROABS 4.1 10/18/2019   HGB 10.4 (L) 10/18/2019   HCT 29.7 (L) 10/18/2019   MCV 94.0 10/18/2019   PLT 212 10/18/2019   Lab Results  Component Value Date   IRON 88 10/18/2019   TIBC 281 10/18/2019   IRONPCTSAT 31 10/18/2019   Lab Results  Component Value Date   FERRITIN 103 10/18/2019     STUDIES: No results found.  ASSESSMENT: Iron deficiency anemia.  PLAN:    1.  Iron deficiency anemia: Patient's hemoglobin has trended down slightly to 10.4, but her iron stores remain within normal limits.  She is also asymptomatic. She reportedly has antibodies making her blood difficult to match, therefore will try to avoid  transfusions in the future. Colonoscopy in September 2020 and upper endoscopy in February 2021 did not reveal any distinct pathology.  Patient also had capsule endoscopy that was incomplete and needs to be repeated in the near future.  Previously, the remainder of blood work was either negative or within normal limits.  She does not require additional IV Feraheme today.  Patient last received treatment on May 28, 2019.  Return to clinic in 3 months with repeat laboratory work, further evaluation, and continuation of treatment if needed. 2.  Hypertension: Chronic and unchanged.  Continue follow-up and treatment per primary care.  I spent a total of 20 minutes reviewing chart data, face-to-face evaluation with the patient, counseling and coordination of care as detailed above.   Patient expressed understanding and was in agreement with this plan. She also understands that She can call clinic at any time with any questions, concerns, or complaints.    Lloyd Huger, MD   10/19/2019 6:51 AM

## 2019-10-18 ENCOUNTER — Inpatient Hospital Stay: Payer: Medicare PPO | Attending: Oncology

## 2019-10-18 ENCOUNTER — Inpatient Hospital Stay: Payer: Medicare PPO | Admitting: Oncology

## 2019-10-18 ENCOUNTER — Inpatient Hospital Stay: Payer: Medicare PPO

## 2019-10-18 ENCOUNTER — Other Ambulatory Visit: Payer: Self-pay

## 2019-10-18 ENCOUNTER — Encounter: Payer: Self-pay | Admitting: Oncology

## 2019-10-18 VITALS — BP 161/62 | HR 59 | Temp 96.4°F | Resp 18 | Wt 176.9 lb

## 2019-10-18 DIAGNOSIS — N189 Chronic kidney disease, unspecified: Secondary | ICD-10-CM | POA: Insufficient documentation

## 2019-10-18 DIAGNOSIS — Z791 Long term (current) use of non-steroidal anti-inflammatories (NSAID): Secondary | ICD-10-CM | POA: Diagnosis not present

## 2019-10-18 DIAGNOSIS — D509 Iron deficiency anemia, unspecified: Secondary | ICD-10-CM

## 2019-10-18 DIAGNOSIS — D649 Anemia, unspecified: Secondary | ICD-10-CM

## 2019-10-18 DIAGNOSIS — I4891 Unspecified atrial fibrillation: Secondary | ICD-10-CM | POA: Insufficient documentation

## 2019-10-18 DIAGNOSIS — I509 Heart failure, unspecified: Secondary | ICD-10-CM | POA: Diagnosis not present

## 2019-10-18 DIAGNOSIS — Z79899 Other long term (current) drug therapy: Secondary | ICD-10-CM | POA: Diagnosis not present

## 2019-10-18 DIAGNOSIS — I130 Hypertensive heart and chronic kidney disease with heart failure and stage 1 through stage 4 chronic kidney disease, or unspecified chronic kidney disease: Secondary | ICD-10-CM | POA: Diagnosis not present

## 2019-10-18 DIAGNOSIS — Z7901 Long term (current) use of anticoagulants: Secondary | ICD-10-CM | POA: Diagnosis not present

## 2019-10-18 DIAGNOSIS — E785 Hyperlipidemia, unspecified: Secondary | ICD-10-CM | POA: Diagnosis not present

## 2019-10-18 DIAGNOSIS — D631 Anemia in chronic kidney disease: Secondary | ICD-10-CM | POA: Insufficient documentation

## 2019-10-18 DIAGNOSIS — I252 Old myocardial infarction: Secondary | ICD-10-CM | POA: Diagnosis not present

## 2019-10-18 DIAGNOSIS — I2510 Atherosclerotic heart disease of native coronary artery without angina pectoris: Secondary | ICD-10-CM | POA: Insufficient documentation

## 2019-10-18 LAB — CBC WITH DIFFERENTIAL/PLATELET
Abs Immature Granulocytes: 0.02 10*3/uL (ref 0.00–0.07)
Basophils Absolute: 0 10*3/uL (ref 0.0–0.1)
Basophils Relative: 1 %
Eosinophils Absolute: 0.1 10*3/uL (ref 0.0–0.5)
Eosinophils Relative: 2 %
HCT: 29.7 % — ABNORMAL LOW (ref 36.0–46.0)
Hemoglobin: 10.4 g/dL — ABNORMAL LOW (ref 12.0–15.0)
Immature Granulocytes: 0 %
Lymphocytes Relative: 17 %
Lymphs Abs: 1 10*3/uL (ref 0.7–4.0)
MCH: 32.9 pg (ref 26.0–34.0)
MCHC: 35 g/dL (ref 30.0–36.0)
MCV: 94 fL (ref 80.0–100.0)
Monocytes Absolute: 0.6 10*3/uL (ref 0.1–1.0)
Monocytes Relative: 10 %
Neutro Abs: 4.1 10*3/uL (ref 1.7–7.7)
Neutrophils Relative %: 70 %
Platelets: 212 10*3/uL (ref 150–400)
RBC: 3.16 MIL/uL — ABNORMAL LOW (ref 3.87–5.11)
RDW: 11.8 % (ref 11.5–15.5)
WBC: 5.8 10*3/uL (ref 4.0–10.5)
nRBC: 0 % (ref 0.0–0.2)

## 2019-10-18 LAB — IRON AND TIBC
Iron: 88 ug/dL (ref 28–170)
Saturation Ratios: 31 % (ref 10.4–31.8)
TIBC: 281 ug/dL (ref 250–450)
UIBC: 193 ug/dL

## 2019-10-18 LAB — FERRITIN: Ferritin: 103 ng/mL (ref 11–307)

## 2020-01-15 NOTE — Progress Notes (Signed)
[place]  Telephone:(336) [phone] Fax:(336) [phone]  ID: EH SAUSEDA OB: Feb 24, 1932  MR#: [id number]  QIH#:[id number]  Patient Care Team: Idelle Crouch, MD as PCP - General (Internal Medicine)  CHIEF COMPLAINT: Iron deficiency anemia.  INTERVAL HISTORY: Patient returns to clinic today for repeat laboratory work, further evaluation, and consideration of additional IV Feraheme.  She continues to feel well and remains asymptomatic.  She does not complain of any weakness or fatigue.  She has a good appetite and denies weight loss.  She has no neurologic complaints.  She denies any recent fevers or illnesses.  She has no chest pain, shortness of breath, cough, or hemoptysis.  She denies any nausea, vomiting, constipation, or diarrhea.  She denies any melena or hematochezia.  She has no urinary complaints.  Patient feels at her baseline and offers no specific complaints today.  REVIEW OF SYSTEMS:   Review of Systems  Constitutional: Negative.  Negative for fever, malaise/fatigue and weight loss.  Respiratory: Negative.  Negative for cough and shortness of breath.   Cardiovascular: Negative.  Negative for chest pain and leg swelling.  Gastrointestinal: Negative.  Negative for abdominal pain, blood in stool and melena.  Genitourinary: Negative.  Negative for hematuria.  Musculoskeletal: Negative.  Negative for back pain.  Skin: Negative.  Negative for rash.  Neurological: Negative.  Negative for dizziness, focal weakness, weakness and headaches.  Psychiatric/Behavioral: Negative.  The patient is not nervous/anxious.     As per HPI. Otherwise, a complete review of systems is negative.  PAST MEDICAL HISTORY: Past Medical History:  Diagnosis Date  . Anemia   . 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 CHF (congestive heart failure) ([place])   . Chronic back pain   . Chronic kidney disease   . Coronary artery disease   . Dysrhythmia    A-fib, ventricular ectopy with  palpitations  . Fibrocystic breast disease   . GERD (gastroesophageal reflux disease)   . Heart murmur   . Hyperlipidemia   . Hypertension   . Ischemic colitis ([place])   . Myocardial infarction ([place])   . Obesity   . Peptic ulcer disease   . Renovascular hypertension   . Thyroid disease   . Venous stasis     PAST SURGICAL HISTORY: Past Surgical History:  Procedure Laterality Date  . ABDOMINAL HYSTERECTOMY    . CARDIAC ELECTROPHYSIOLOGY MAPPING AND ABLATION    . COLONOSCOPY WITH PROPOFOL N/A 11/26/2018   Procedure: COLONOSCOPY WITH PROPOFO;  Surgeon: Lollie Sails, MD;  Location: [facility] ENDOSCOPY;  Service: Endoscopy;  Laterality: N/A;  . ESOPHAGOGASTRODUODENOSCOPY (EGD) WITH PROPOFOL N/A 04/28/2019   Procedure: ESOPHAGOGASTRODUODENOSCOPY (EGD) WITH PROPOFOL;  Surgeon: Robert Bellow, MD;  Location: [facility] ENDOSCOPY;  Service: Endoscopy;  Laterality: N/A;  . GIVENS CAPSULE STUDY N/A 05/06/2019   Procedure: GIVENS CAPSULE STUDY;  Surgeon: Robert Bellow, MD;  Location: [facility] ENDOSCOPY;  Service: Endoscopy;  Laterality: N/A;  . HAND DEBRIDEMENT Left 11/2015   Debridement skin/SQ tissue, wrist/hand/finger  . MASTECTOMY Bilateral   . radial artery repair Left 11/2015   aneurysm/pseudoaneurysm repair, graft insertion of radial/ulnar artery    FAMILY HISTORY: History reviewed. No pertinent family history.  ADVANCED DIRECTIVES (Y/N):  N  HEALTH MAINTENANCE: Social History   Tobacco Use  . Smoking status: Never Smoker  . Smokeless tobacco: Never Used  Vaping Use  . Vaping Use: Never used  Substance Use Topics  . Alcohol use: Yes    Comment: occasional wine  qhs  . Drug use: Never     Colonoscopy:  PAP:  Bone density:  Lipid panel:  Allergies  Allergen Reactions  . Amlodipine   . Codeine   . Cortisone   . Hydrochlorothiazide Other (See Comments)    Confusion  . Lisinopril   . Phenergan [Promethazine]   . Prednisone   . Spironolactone   . Tikosyn [Dofetilide]      Current Outpatient Medications  Medication Sig Dispense Refill  . acetaminophen (TYLENOL) 500 MG tablet Take 1,000 mg by mouth every 6 (six) hours as needed.    . ALPRAZolam (XANAX) 0.25 MG tablet Take 0.25 mg by mouth 3 (three) times daily as needed for anxiety or sleep.    Marland Kitchen apixaban (ELIQUIS) 5 MG TABS tablet Take 5 mg by mouth every 12 (twelve) hours.    . carvedilol (COREG) 25 MG tablet Take 25 mg by mouth every 12 (twelve) hours.    . cloNIDine (CATAPRES) 0.1 MG tablet Take 0.1 mg by mouth 2 (two) times daily. Take 2 tablets with breakfast and lunch, 3 tablets at bedtime    . diclofenac sodium (VOLTAREN) 1 % GEL Apply 2 g topically 4 (four) times daily.    Marland Kitchen DIPHENHYDRAMINE-PE-APAP PO Take 1 tablet by mouth at bedtime as needed ("Legatrin PM" OTC. For muscle cramps and sleep PRN).    . ferrous sulfate 325 (65 FE) MG tablet Take 325 mg by mouth daily with breakfast.    . furosemide (LASIX) 40 MG tablet Take 40 mg by mouth 2 (two) times daily.    Marland Kitchen gabapentin (NEURONTIN) 300 MG capsule Take 300 mg by mouth at bedtime.    . hydrALAZINE (APRESOLINE) 50 MG tablet Take 50 mg by mouth 3 (three) times daily.    . lansoprazole (PREVACID) 15 MG capsule Take 15 mg by mouth 2 (two) times daily.    Marland Kitchen levothyroxine (SYNTHROID) 75 MCG tablet Take 75 mcg by mouth daily before breakfast. Take qd on empty stomach with a glass of water at least 30-60 minutes before breakfast    . linaclotide (LINZESS) 290 MCG CAPS capsule Take by mouth.    . losartan (COZAAR) 100 MG tablet Take 100 mg by mouth daily.    . metoCLOPramide (REGLAN) 5 MG tablet Take 5 mg by mouth 3 (three) times daily before meals. For 30 days    . pravastatin (PRAVACHOL) 80 MG tablet Take 80 mg by mouth daily. At night    . prazosin (MINIPRESS) 1 MG capsule     . senna (SENOKOT) 8.6 MG tablet Take 1 tablet by mouth daily.    Marland Kitchen triamcinolone cream (KENALOG) 0.5 % Apply 1 application topically 2 (two) times daily.    . verapamil  (CALAN-SR) 180 MG CR tablet Take 180 mg by mouth daily.     No current facility-administered medications for this visit.    OBJECTIVE: Vitals:   01/20/20 1536  BP: (!) 152/56  Pulse: 62  Resp: 20  Temp: (!) 97.5 F (36.4 C)  SpO2: 100%     Body mass index is 28.41 kg/m.    ECOG FS:0 - Asymptomatic  General: Well-developed, well-nourished, no acute distress. Eyes: Pink conjunctiva, anicteric sclera. HEENT: Normocephalic, moist mucous membranes. Lungs: No audible wheezing or coughing. Heart: Regular rate and rhythm. Abdomen: Soft, nontender, no obvious distention. Musculoskeletal: No edema, cyanosis, or clubbing. Neuro: Alert, answering all questions appropriately. Cranial nerves grossly intact. Skin: No rashes or petechiae noted. Psych: Normal affect.   LAB RESULTS:  Lab Results  Component Value Date   NA 135 (L) 03/31/2013   K 4.7 03/31/2013   CL 104 03/31/2013   CO2 25 03/31/2013   GLUCOSE 115 (H) 03/31/2013   BUN 22 (H) 03/31/2013   CREATININE 0.94 03/31/2013   CALCIUM 7.9 (L) 03/31/2013   PROT 6.1 (L) 03/31/2013   ALBUMIN 3.4 03/31/2013   AST 33 03/31/2013   ALT 26 03/31/2013   ALKPHOS 68 03/31/2013   BILITOT 0.6 03/31/2013   GFRNONAA 58 (L) 03/31/2013   GFRAA >60 03/31/2013    Lab Results  Component Value Date   WBC 5.4 01/21/2020   NEUTROABS 3.6 01/21/2020   HGB 10.4 (L) 01/21/2020   HCT 29.6 (L) 01/21/2020   MCV 93.4 01/21/2020   PLT 247 01/21/2020   Lab Results  Component Value Date   IRON 108 01/21/2020   TIBC 315 01/21/2020   IRONPCTSAT 34 (H) 01/21/2020   Lab Results  Component Value Date   FERRITIN 104 01/21/2020     STUDIES: No results found.  ASSESSMENT: Iron deficiency anemia.  PLAN:    1.  Iron deficiency anemia: Patient's hemoglobin remains chronically decreased, but unchanged.  Iron stores continue to be within normal limits.  She remains asymptomatic. She reportedly has antibodies making her blood difficult to match,  therefore will try to avoid transfusions in the future. Colonoscopy in September 2020 and upper endoscopy in February 2021 did not reveal any distinct pathology.  Patient also had capsule endoscopy that was incomplete and needs to be repeated in the near future.  Previously, the remainder of blood work was either negative or within normal limits.  She does not require additional IV Feraheme today.  Patient last received treatment on May 28, 2019.  Return to clinic in 4 months with repeat laboratory, further evaluation, and continuation of treatment if needed.  If patient does not require treatment at that time, can consider discharging from clinic. 2.  Hypertension: Chronic and unchanged.  Continue follow-up and treatment per primary care.  I spent a total of 20 minutes reviewing chart data, face-to-face evaluation with the patient, counseling and coordination of care as detailed above.    Patient expressed understanding and was in agreement with this plan. She also understands that She can call clinic at any time with any questions, concerns, or complaints.    Lloyd Huger, MD   01/22/2020 10:53 AM

## 2020-01-20 ENCOUNTER — Encounter: Payer: Self-pay | Admitting: Oncology

## 2020-01-20 NOTE — Progress Notes (Signed)
Spoke to patient's daughter for pre assessment. She denies any pain or concerns at this time.

## 2020-01-21 ENCOUNTER — Inpatient Hospital Stay: Payer: Medicare PPO

## 2020-01-21 ENCOUNTER — Inpatient Hospital Stay: Payer: Medicare PPO | Admitting: Oncology

## 2020-01-21 ENCOUNTER — Other Ambulatory Visit: Payer: Self-pay

## 2020-01-21 ENCOUNTER — Inpatient Hospital Stay: Payer: Medicare PPO | Attending: Oncology

## 2020-01-21 ENCOUNTER — Encounter: Payer: Self-pay | Admitting: Oncology

## 2020-01-21 VITALS — BP 152/56 | HR 62 | Temp 97.5°F | Resp 20 | Wt 176.0 lb

## 2020-01-21 DIAGNOSIS — Z791 Long term (current) use of non-steroidal anti-inflammatories (NSAID): Secondary | ICD-10-CM | POA: Insufficient documentation

## 2020-01-21 DIAGNOSIS — Z79899 Other long term (current) drug therapy: Secondary | ICD-10-CM | POA: Diagnosis not present

## 2020-01-21 DIAGNOSIS — I130 Hypertensive heart and chronic kidney disease with heart failure and stage 1 through stage 4 chronic kidney disease, or unspecified chronic kidney disease: Secondary | ICD-10-CM | POA: Insufficient documentation

## 2020-01-21 DIAGNOSIS — E785 Hyperlipidemia, unspecified: Secondary | ICD-10-CM | POA: Diagnosis not present

## 2020-01-21 DIAGNOSIS — I4891 Unspecified atrial fibrillation: Secondary | ICD-10-CM | POA: Insufficient documentation

## 2020-01-21 DIAGNOSIS — I2510 Atherosclerotic heart disease of native coronary artery without angina pectoris: Secondary | ICD-10-CM | POA: Diagnosis not present

## 2020-01-21 DIAGNOSIS — Z7901 Long term (current) use of anticoagulants: Secondary | ICD-10-CM | POA: Diagnosis not present

## 2020-01-21 DIAGNOSIS — D509 Iron deficiency anemia, unspecified: Secondary | ICD-10-CM

## 2020-01-21 DIAGNOSIS — I252 Old myocardial infarction: Secondary | ICD-10-CM | POA: Diagnosis not present

## 2020-01-21 DIAGNOSIS — I509 Heart failure, unspecified: Secondary | ICD-10-CM | POA: Diagnosis not present

## 2020-01-21 DIAGNOSIS — N189 Chronic kidney disease, unspecified: Secondary | ICD-10-CM | POA: Insufficient documentation

## 2020-01-21 DIAGNOSIS — E079 Disorder of thyroid, unspecified: Secondary | ICD-10-CM | POA: Diagnosis not present

## 2020-01-21 DIAGNOSIS — D649 Anemia, unspecified: Secondary | ICD-10-CM

## 2020-01-21 DIAGNOSIS — K219 Gastro-esophageal reflux disease without esophagitis: Secondary | ICD-10-CM | POA: Diagnosis not present

## 2020-01-21 LAB — CBC WITH DIFFERENTIAL/PLATELET
Abs Immature Granulocytes: 0.02 10*3/uL (ref 0.00–0.07)
Basophils Absolute: 0 10*3/uL (ref 0.0–0.1)
Basophils Relative: 1 %
Eosinophils Absolute: 0.1 10*3/uL (ref 0.0–0.5)
Eosinophils Relative: 2 %
HCT: 29.6 % — ABNORMAL LOW (ref 36.0–46.0)
Hemoglobin: 10.4 g/dL — ABNORMAL LOW (ref 12.0–15.0)
Immature Granulocytes: 0 %
Lymphocytes Relative: 21 %
Lymphs Abs: 1.1 10*3/uL (ref 0.7–4.0)
MCH: 32.8 pg (ref 26.0–34.0)
MCHC: 35.1 g/dL (ref 30.0–36.0)
MCV: 93.4 fL (ref 80.0–100.0)
Monocytes Absolute: 0.6 10*3/uL (ref 0.1–1.0)
Monocytes Relative: 11 %
Neutro Abs: 3.6 10*3/uL (ref 1.7–7.7)
Neutrophils Relative %: 65 %
Platelets: 247 10*3/uL (ref 150–400)
RBC: 3.17 MIL/uL — ABNORMAL LOW (ref 3.87–5.11)
RDW: 11.8 % (ref 11.5–15.5)
WBC: 5.4 10*3/uL (ref 4.0–10.5)
nRBC: 0 % (ref 0.0–0.2)

## 2020-01-21 LAB — IRON AND TIBC
Iron: 108 ug/dL (ref 28–170)
Saturation Ratios: 34 % — ABNORMAL HIGH (ref 10.4–31.8)
TIBC: 315 ug/dL (ref 250–450)
UIBC: 207 ug/dL

## 2020-01-21 LAB — FERRITIN: Ferritin: 104 ng/mL (ref 11–307)

## 2020-02-22 ENCOUNTER — Telehealth: Payer: Self-pay | Admitting: Nurse Practitioner

## 2020-02-22 DIAGNOSIS — U071 COVID-19: Secondary | ICD-10-CM

## 2020-02-22 NOTE — Telephone Encounter (Signed)
Called to discuss with Lucila Maine about Covid symptoms and the use of a monoclonal antibody infusion for those with mild to moderate Covid symptoms and at a high risk of hospitalization.     Pt is qualified for this infusion at the [facility] center due to co-morbid conditions and/or a member of an at-risk group, however declines infusion at this time. Patient is currently receiving infusion at [facility].     Patient Active Problem List   Diagnosis Date Noted  . Iron deficiency anemia 05/14/2019   Beckey Rutter, NP [phone] [email or web address]

## 2020-02-28 ENCOUNTER — Other Ambulatory Visit: Payer: Self-pay | Admitting: Internal Medicine

## 2020-02-28 DIAGNOSIS — R110 Nausea: Secondary | ICD-10-CM

## 2020-02-28 DIAGNOSIS — R1084 Generalized abdominal pain: Secondary | ICD-10-CM

## 2020-02-29 ENCOUNTER — Ambulatory Visit
Admission: RE | Admit: 2020-02-29 | Discharge: 2020-02-29 | Disposition: A | Discharge status: Home / Self Care | Payer: Medicare PPO | Source: Ambulatory Visit | Attending: Internal Medicine | Admitting: Internal Medicine

## 2020-02-29 ENCOUNTER — Other Ambulatory Visit: Payer: Self-pay | Admitting: Internal Medicine

## 2020-02-29 ENCOUNTER — Other Ambulatory Visit: Payer: Self-pay

## 2020-02-29 DIAGNOSIS — R9389 Abnormal findings on diagnostic imaging of other specified body structures: Secondary | ICD-10-CM

## 2020-02-29 DIAGNOSIS — U071 COVID-19: Secondary | ICD-10-CM | POA: Diagnosis present

## 2020-03-10 ENCOUNTER — Inpatient Hospital Stay
Admission: EM | Admit: 2020-03-10 | Discharge: 2020-03-14 | DRG: 644 | Disposition: A | Discharge status: Home Health | Payer: Medicare PPO | Attending: Internal Medicine | Admitting: Internal Medicine

## 2020-03-10 ENCOUNTER — Emergency Department: Payer: Medicare PPO

## 2020-03-10 ENCOUNTER — Encounter: Payer: Self-pay | Admitting: Emergency Medicine

## 2020-03-10 ENCOUNTER — Other Ambulatory Visit: Payer: Self-pay

## 2020-03-10 DIAGNOSIS — U099 Post covid-19 condition, unspecified: Secondary | ICD-10-CM | POA: Diagnosis present

## 2020-03-10 DIAGNOSIS — E871 Hypo-osmolality and hyponatremia: Secondary | ICD-10-CM | POA: Diagnosis present

## 2020-03-10 DIAGNOSIS — E079 Disorder of thyroid, unspecified: Secondary | ICD-10-CM | POA: Diagnosis present

## 2020-03-10 DIAGNOSIS — G8929 Other chronic pain: Secondary | ICD-10-CM | POA: Diagnosis present

## 2020-03-10 DIAGNOSIS — E222 Syndrome of inappropriate secretion of antidiuretic hormone: Secondary | ICD-10-CM | POA: Diagnosis not present

## 2020-03-10 DIAGNOSIS — E785 Hyperlipidemia, unspecified: Secondary | ICD-10-CM | POA: Diagnosis present

## 2020-03-10 DIAGNOSIS — Z885 Allergy status to narcotic agent status: Secondary | ICD-10-CM

## 2020-03-10 DIAGNOSIS — K8020 Calculus of gallbladder without cholecystitis without obstruction: Secondary | ICD-10-CM | POA: Diagnosis present

## 2020-03-10 DIAGNOSIS — I480 Paroxysmal atrial fibrillation: Secondary | ICD-10-CM | POA: Diagnosis present

## 2020-03-10 DIAGNOSIS — E039 Hypothyroidism, unspecified: Secondary | ICD-10-CM | POA: Diagnosis present

## 2020-03-10 DIAGNOSIS — Z90710 Acquired absence of both cervix and uterus: Secondary | ICD-10-CM

## 2020-03-10 DIAGNOSIS — I509 Heart failure, unspecified: Secondary | ICD-10-CM

## 2020-03-10 DIAGNOSIS — N1831 Chronic kidney disease, stage 3a: Secondary | ICD-10-CM | POA: Diagnosis present

## 2020-03-10 DIAGNOSIS — I130 Hypertensive heart and chronic kidney disease with heart failure and stage 1 through stage 4 chronic kidney disease, or unspecified chronic kidney disease: Secondary | ICD-10-CM | POA: Diagnosis present

## 2020-03-10 DIAGNOSIS — K219 Gastro-esophageal reflux disease without esophagitis: Secondary | ICD-10-CM | POA: Diagnosis present

## 2020-03-10 DIAGNOSIS — I2510 Atherosclerotic heart disease of native coronary artery without angina pectoris: Secondary | ICD-10-CM | POA: Diagnosis present

## 2020-03-10 DIAGNOSIS — Z79890 Hormone replacement therapy (postmenopausal): Secondary | ICD-10-CM

## 2020-03-10 DIAGNOSIS — Z79899 Other long term (current) drug therapy: Secondary | ICD-10-CM

## 2020-03-10 DIAGNOSIS — I252 Old myocardial infarction: Secondary | ICD-10-CM

## 2020-03-10 DIAGNOSIS — Z6829 Body mass index (BMI) 29.0-29.9, adult: Secondary | ICD-10-CM

## 2020-03-10 DIAGNOSIS — N183 Chronic kidney disease, stage 3 unspecified: Secondary | ICD-10-CM

## 2020-03-10 DIAGNOSIS — F411 Generalized anxiety disorder: Secondary | ICD-10-CM | POA: Diagnosis present

## 2020-03-10 DIAGNOSIS — K5900 Constipation, unspecified: Secondary | ICD-10-CM | POA: Diagnosis present

## 2020-03-10 DIAGNOSIS — E669 Obesity, unspecified: Secondary | ICD-10-CM | POA: Diagnosis present

## 2020-03-10 DIAGNOSIS — K2900 Acute gastritis without bleeding: Secondary | ICD-10-CM | POA: Diagnosis present

## 2020-03-10 DIAGNOSIS — N189 Chronic kidney disease, unspecified: Secondary | ICD-10-CM | POA: Diagnosis present

## 2020-03-10 DIAGNOSIS — I5032 Chronic diastolic (congestive) heart failure: Secondary | ICD-10-CM | POA: Diagnosis present

## 2020-03-10 DIAGNOSIS — I10 Essential (primary) hypertension: Secondary | ICD-10-CM | POA: Diagnosis present

## 2020-03-10 DIAGNOSIS — Z9013 Acquired absence of bilateral breasts and nipples: Secondary | ICD-10-CM

## 2020-03-10 DIAGNOSIS — Z888 Allergy status to other drugs, medicaments and biological substances status: Secondary | ICD-10-CM

## 2020-03-10 DIAGNOSIS — Z7901 Long term (current) use of anticoagulants: Secondary | ICD-10-CM

## 2020-03-10 DIAGNOSIS — I160 Hypertensive urgency: Secondary | ICD-10-CM | POA: Diagnosis present

## 2020-03-10 DIAGNOSIS — R112 Nausea with vomiting, unspecified: Secondary | ICD-10-CM

## 2020-03-10 LAB — COMPREHENSIVE METABOLIC PANEL
ALT: 15 U/L (ref 0–44)
AST: 31 U/L (ref 15–41)
Albumin: 4.1 g/dL (ref 3.5–5.0)
Alkaline Phosphatase: 72 U/L (ref 38–126)
Anion gap: 9 (ref 5–15)
BUN: 18 mg/dL (ref 8–23)
CO2: 25 mmol/L (ref 22–32)
Calcium: 9.3 mg/dL (ref 8.9–10.3)
Chloride: 89 mmol/L — ABNORMAL LOW (ref 98–111)
Creatinine, Ser: 1.11 mg/dL — ABNORMAL HIGH (ref 0.44–1.00)
GFR, Estimated: 48 mL/min — ABNORMAL LOW (ref >60)
Glucose, Bld: 126 mg/dL — ABNORMAL HIGH (ref 70–99)
Potassium: 4.8 mmol/L (ref 3.5–5.1)
Sodium: 123 mmol/L — ABNORMAL LOW (ref 135–145)
Total Bilirubin: 0.9 mg/dL (ref 0.3–1.2)
Total Protein: 6.7 g/dL (ref 6.5–8.1)

## 2020-03-10 LAB — CBC
HCT: 31.4 % — ABNORMAL LOW (ref 36.0–46.0)
Hemoglobin: 11 g/dL — ABNORMAL LOW (ref 12.0–15.0)
MCH: 31.9 pg (ref 26.0–34.0)
MCHC: 35 g/dL (ref 30.0–36.0)
MCV: 91 fL (ref 80.0–100.0)
Platelets: 395 10*3/uL (ref 150–400)
RBC: 3.45 MIL/uL — ABNORMAL LOW (ref 3.87–5.11)
RDW: 12.1 % (ref 11.5–15.5)
WBC: 13 10*3/uL — ABNORMAL HIGH (ref 4.0–10.5)
nRBC: 0 % (ref 0.0–0.2)

## 2020-03-10 LAB — OSMOLALITY, URINE: Osmolality, Ur: 426 mOsm/kg (ref 300–900)

## 2020-03-10 LAB — ABO/RH: ABO/RH(D): O NEG

## 2020-03-10 LAB — TROPONIN I (HIGH SENSITIVITY)
Troponin I (High Sensitivity): 30 ng/L — ABNORMAL HIGH (ref <18)
Troponin I (High Sensitivity): 31 ng/L — ABNORMAL HIGH (ref <18)

## 2020-03-10 LAB — PROTIME-INR
INR: 1.5 — ABNORMAL HIGH (ref 0.8–1.2)
Prothrombin Time: 17.9 seconds — ABNORMAL HIGH (ref 11.4–15.2)

## 2020-03-10 LAB — URINALYSIS, COMPLETE (UACMP) WITH MICROSCOPIC
Bacteria, UA: NONE SEEN
Bilirubin Urine: NEGATIVE
Glucose, UA: NEGATIVE mg/dL
Hgb urine dipstick: NEGATIVE
Ketones, ur: NEGATIVE mg/dL
Leukocytes,Ua: NEGATIVE
Nitrite: NEGATIVE
Protein, ur: NEGATIVE mg/dL
Specific Gravity, Urine: 1.033 — ABNORMAL HIGH (ref 1.005–1.030)
pH: 5 (ref 5.0–8.0)

## 2020-03-10 LAB — SODIUM, URINE, RANDOM: Sodium, Ur: 26 mmol/L

## 2020-03-10 LAB — LIPASE, BLOOD: Lipase: 32 U/L (ref 11–51)

## 2020-03-10 LAB — MAGNESIUM: Magnesium: 2 mg/dL (ref 1.7–2.4)

## 2020-03-10 MED ORDER — LORAZEPAM 2 MG/ML IJ SOLN
0.5000 mg | Freq: Once | INTRAMUSCULAR | Status: AC
  Administered 2020-03-10: 22:00:00 0.5 mg via INTRAVENOUS
  Filled 2020-03-10: qty 1

## 2020-03-10 MED ORDER — LORAZEPAM 2 MG/ML IJ SOLN
0.5000 mg | Freq: Once | INTRAMUSCULAR | Status: AC
  Administered 2020-03-10: 16:00:00 0.5 mg via INTRAVENOUS
  Filled 2020-03-10: qty 1

## 2020-03-10 MED ORDER — HYDRALAZINE HCL 50 MG PO TABS
50.0000 mg | ORAL_TABLET | Freq: Three times a day (TID) | ORAL | Status: DC
  Administered 2020-03-10 – 2020-03-14 (×12): 50 mg via ORAL
  Filled 2020-03-10 (×12): qty 1

## 2020-03-10 MED ORDER — LINACLOTIDE 290 MCG PO CAPS
290.0000 ug | ORAL_CAPSULE | Freq: Every day | ORAL | Status: DC
  Administered 2020-03-11 – 2020-03-14 (×4): 290 ug via ORAL
  Filled 2020-03-10 (×5): qty 1

## 2020-03-10 MED ORDER — ONDANSETRON 4 MG PO TBDP
4.0000 mg | ORAL_TABLET | Freq: Once | ORAL | Status: AC | PRN
  Administered 2020-03-10: 06:00:00 4 mg via ORAL
  Filled 2020-03-10: qty 1

## 2020-03-10 MED ORDER — LABETALOL HCL 5 MG/ML IV SOLN
0.5000 mg/min | Status: DC
  Filled 2020-03-10: qty 80

## 2020-03-10 MED ORDER — ACETAMINOPHEN 500 MG PO TABS: 500.0000 mg | ORAL_TABLET | Freq: Four times a day (QID) | ORAL | Status: DC | PRN

## 2020-03-10 MED ORDER — SODIUM CHLORIDE 0.45 % IV SOLN: INTRAVENOUS | Status: DC

## 2020-03-10 MED ORDER — ONDANSETRON HCL 4 MG/2ML IJ SOLN
4.0000 mg | Freq: Once | INTRAMUSCULAR | Status: AC
  Administered 2020-03-10: 10:00:00 4 mg via INTRAVENOUS
  Filled 2020-03-10: qty 2

## 2020-03-10 MED ORDER — HYDRALAZINE HCL 20 MG/ML IJ SOLN
5.0000 mg | Freq: Once | INTRAMUSCULAR | Status: AC
  Administered 2020-03-10: 10:00:00 5 mg via INTRAVENOUS
  Filled 2020-03-10: qty 1

## 2020-03-10 MED ORDER — LEVOFLOXACIN IN D5W 750 MG/150ML IV SOLN
750.0000 mg | Freq: Once | INTRAVENOUS | Status: AC
  Administered 2020-03-10: 11:00:00 750 mg via INTRAVENOUS
  Filled 2020-03-10: qty 150

## 2020-03-10 MED ORDER — PANTOPRAZOLE SODIUM 40 MG IV SOLR
40.0000 mg | Freq: Once | INTRAVENOUS | Status: AC
  Administered 2020-03-10: 10:00:00 40 mg via INTRAVENOUS
  Filled 2020-03-10: qty 40

## 2020-03-10 MED ORDER — ALPRAZOLAM 0.25 MG PO TABS
0.2500 mg | ORAL_TABLET | Freq: Three times a day (TID) | ORAL | Status: DC | PRN
  Administered 2020-03-10: 23:00:00 0.25 mg via ORAL
  Filled 2020-03-10: qty 1

## 2020-03-10 MED ORDER — FUROSEMIDE 40 MG PO TABS: 40.0000 mg | ORAL_TABLET | Freq: Every day | ORAL | Status: DC | PRN

## 2020-03-10 MED ORDER — METOCLOPRAMIDE HCL 5 MG/ML IJ SOLN
10.0000 mg | Freq: Three times a day (TID) | INTRAMUSCULAR | Status: AC
  Administered 2020-03-10: 15:00:00 10 mg via INTRAVENOUS
  Filled 2020-03-10: qty 2

## 2020-03-10 MED ORDER — PRAZOSIN HCL 2 MG PO CAPS
2.0000 mg | ORAL_CAPSULE | Freq: Two times a day (BID) | ORAL | Status: DC
  Administered 2020-03-11 – 2020-03-14 (×7): 2 mg via ORAL
  Filled 2020-03-10 (×10): qty 1

## 2020-03-10 MED ORDER — CLONIDINE HCL 0.1 MG PO TABS
0.2000 mg | ORAL_TABLET | ORAL | Status: AC
  Administered 2020-03-10: 11:00:00 0.2 mg via ORAL
  Filled 2020-03-10: qty 2

## 2020-03-10 MED ORDER — CLONIDINE HCL 0.1 MG PO TABS
0.2000 mg | ORAL_TABLET | Freq: Every day | ORAL | Status: DC
  Administered 2020-03-11 – 2020-03-14 (×4): 0.2 mg via ORAL
  Filled 2020-03-10 (×4): qty 2

## 2020-03-10 MED ORDER — LACTATED RINGERS IV BOLUS
500.0000 mL | Freq: Once | INTRAVENOUS | Status: AC
  Administered 2020-03-10: 10:00:00 500 mL via INTRAVENOUS

## 2020-03-10 MED ORDER — VERAPAMIL HCL ER 180 MG PO TBCR
180.0000 mg | EXTENDED_RELEASE_TABLET | Freq: Every day | ORAL | Status: DC
  Administered 2020-03-11 – 2020-03-14 (×4): 180 mg via ORAL
  Filled 2020-03-10 (×4): qty 1

## 2020-03-10 MED ORDER — PANTOPRAZOLE SODIUM 40 MG IV SOLR
40.0000 mg | INTRAVENOUS | Status: DC
  Administered 2020-03-11 – 2020-03-12 (×2): 40 mg via INTRAVENOUS
  Filled 2020-03-10 (×2): qty 40

## 2020-03-10 MED ORDER — CARVEDILOL 25 MG PO TABS
25.0000 mg | ORAL_TABLET | Freq: Two times a day (BID) | ORAL | Status: DC
  Administered 2020-03-10 – 2020-03-14 (×9): 25 mg via ORAL
  Filled 2020-03-10 (×9): qty 1

## 2020-03-10 MED ORDER — GABAPENTIN 300 MG PO CAPS
300.0000 mg | ORAL_CAPSULE | Freq: Every day | ORAL | Status: DC
  Administered 2020-03-10 – 2020-03-13 (×4): 300 mg via ORAL
  Filled 2020-03-10 (×4): qty 1

## 2020-03-10 MED ORDER — LORAZEPAM 2 MG/ML IJ SOLN
0.5000 mg | Freq: Once | INTRAMUSCULAR | Status: AC
  Administered 2020-03-10: 12:00:00 0.5 mg via INTRAVENOUS
  Filled 2020-03-10: qty 1

## 2020-03-10 MED ORDER — HYDRALAZINE HCL 20 MG/ML IJ SOLN
5.0000 mg | Freq: Once | INTRAMUSCULAR | Status: AC
  Administered 2020-03-10: 11:00:00 5 mg via INTRAVENOUS
  Filled 2020-03-10: qty 1

## 2020-03-10 MED ORDER — HYDRALAZINE HCL 20 MG/ML IJ SOLN
10.0000 mg | Freq: Once | INTRAMUSCULAR | Status: AC
  Administered 2020-03-10: 15:00:00 10 mg via INTRAVENOUS
  Filled 2020-03-10: qty 1

## 2020-03-10 MED ORDER — CLONIDINE HCL 0.1 MG PO TABS: 0.1000 mg | ORAL_TABLET | ORAL | Status: DC

## 2020-03-10 MED ORDER — CLONIDINE HCL 0.1 MG PO TABS
0.1000 mg | ORAL_TABLET | Freq: Every day | ORAL | Status: DC
  Administered 2020-03-11 – 2020-03-14 (×4): 0.1 mg via ORAL
  Filled 2020-03-10 (×6): qty 1

## 2020-03-10 MED ORDER — LOSARTAN POTASSIUM 50 MG PO TABS
100.0000 mg | ORAL_TABLET | Freq: Every day | ORAL | Status: DC
  Administered 2020-03-10 – 2020-03-13 (×4): 100 mg via ORAL
  Filled 2020-03-10 (×5): qty 2

## 2020-03-10 MED ORDER — LEVOTHYROXINE SODIUM 50 MCG PO TABS
75.0000 ug | ORAL_TABLET | Freq: Every day | ORAL | Status: DC
  Administered 2020-03-11 – 2020-03-14 (×4): 75 ug via ORAL
  Filled 2020-03-10 (×2): qty 1
  Filled 2020-03-10: qty 2
  Filled 2020-03-10 (×2): qty 1

## 2020-03-10 MED ORDER — MAGNESIUM OXIDE 400 (241.3 MG) MG PO TABS
400.0000 mg | ORAL_TABLET | Freq: Two times a day (BID) | ORAL | Status: DC
  Administered 2020-03-10 – 2020-03-14 (×8): 400 mg via ORAL
  Filled 2020-03-10 (×8): qty 1

## 2020-03-10 MED ORDER — PRAVASTATIN SODIUM 20 MG PO TABS
80.0000 mg | ORAL_TABLET | Freq: Every day | ORAL | Status: DC
  Administered 2020-03-11 – 2020-03-13 (×3): 80 mg via ORAL
  Filled 2020-03-10 (×3): qty 2
  Filled 2020-03-10: qty 4

## 2020-03-10 MED ORDER — CLONIDINE HCL 0.1 MG PO TABS
0.3000 mg | ORAL_TABLET | Freq: Every day | ORAL | Status: DC
  Administered 2020-03-10 – 2020-03-13 (×4): 0.3 mg via ORAL
  Filled 2020-03-10 (×5): qty 3

## 2020-03-10 MED ORDER — FUROSEMIDE 40 MG PO TABS: 40.0000 mg | ORAL_TABLET | Freq: Every day | ORAL | Status: DC

## 2020-03-10 MED ORDER — IOHEXOL 350 MG/ML SOLN
75.0000 mL | Freq: Once | INTRAVENOUS | Status: AC | PRN
  Administered 2020-03-10: 10:00:00 75 mL via INTRAVENOUS

## 2020-03-10 NOTE — ED Notes (Signed)
Pt finally resting with eyes closed at this time. Daughter at bedside. BP improving. Daughter at bedside and call bell in reach.

## 2020-03-10 NOTE — Progress Notes (Signed)
   03/10/20 2141  Assess: MEWS Score  Temp 99 F (37.2 C)  BP (!) 214/61  Pulse Rate 92  Resp 16  SpO2 96 %  O2 Device Room Air  Assess: MEWS Score  MEWS Temp 0  MEWS Systolic 2  MEWS Pulse 0  MEWS RR 0  MEWS LOC 0  MEWS Score 2  MEWS Score Color Yellow  Assess: if the MEWS score is Yellow or Red  Were vital signs taken at a resting state? Yes  Focused Assessment No change from prior assessment  Early Detection of Sepsis Score *See Row Information* Medium  MEWS guidelines implemented *See Row Information* No, previously yellow, continue vital signs every 4 hours  Treat  MEWS Interventions Administered scheduled meds/treatments;Administered prn meds/treatments  Pain Scale 0-10  Pain Score 0  Take Vital Signs  Increase Vital Sign Frequency  Yellow: Q 2hr X 2 then Q 4hr X 2, if remains yellow, continue Q 4hrs  Escalate  MEWS: Escalate Yellow: discuss with charge nurse/RN and consider discussing with provider and RRT  Notify: Charge Nurse/RN  Name of Charge Nurse/RN Notified marcel turner, RN  Date Charge Nurse/RN Notified 03/10/20  Time Charge Nurse/RN Notified 2200

## 2020-03-10 NOTE — ED Triage Notes (Signed)
Pt to ED via EMS from home c/o n/v, malaise, SOB and CP since yesterday, prescribed zofran and taking with some relief.  States some emesis had charcoal look last night with hx of same.  Decreased appetite and drinking.  Pt had COVID on December 3rd also started treatment for UTI 3-4 days ago.  Pt A&Ox4, speaking in complete and coherent sentences, in NAD at this time.  EMS vitals: 250/86, 98.4, CBG 123  Daughter, Clarene Critchley, [phone]

## 2020-03-10 NOTE — Progress Notes (Addendum)
Pt reports that she's nauseous and per pt and daughter ativan works better in managing her nausea. Notify NP [place]. Will continue to monitor.  Update 2219: Ordered Ativan 0.5 mg IV once. Will continue to monitor.

## 2020-03-10 NOTE — ED Notes (Signed)
Pt resting at this time. Pt still has c/o of nausea. Pt still hypertensive at this time. Daughter at bedside. Call bell in reach. Will speak to MD about nausea medication.

## 2020-03-10 NOTE — ED Notes (Signed)
Pt currently in CT.

## 2020-03-10 NOTE — H&P (Addendum)
History and Physical    Andrea Haley JKD:[id number] DOB: May 16, 1931 DOA: 03/10/2020  PCP: Idelle Crouch, MD   Patient coming from: Home  I have personally briefly reviewed patient's old medical records in [place]  Chief Complaint: Generalized weakness  HPI: Andrea Haley is a 86 y.o. female with medical history significant for recent Covid 19 viral infection(Covid PCR test was positive on 02/18/20) patient had predominantly gastrointestinal symptoms which include nausea, vomiting and anorexia, history of A. fib, coronary artery disease, hypertension, CHF and thyroid disease who presents to the ER via EMS for evaluation of multiple symptoms which include generalized weakness, nausea, vomiting and chest pain.  Patient states that she had some coffee-ground emesis. Her daughter who provides most of the history states her mother has had nausea, vomiting and poor oral intake since her diagnosis with Covid over 3 weeks ago.  She states that her oral intake has been very poor but over the last 1 week she has not had any significant meals due to persistent nausea and vomiting.  She is unsure when she had a last bowel movement but thinks it was over a week ago. Since her diagnosis with Covid she has been treated with antibiotics for presumed superimposed bacterial pneumonia and is supposed to be on a different antibiotic for UTI according to her daughter. She complains of feeling very weak and tired and also complains of abdominal pain mostly in the epigastrium and left upper quadrant.  She rates her pain a 3 to 4 x 10 in intensity at its worst and states that it is nonradiating but is associated with nausea and vomiting.  She denies having any known aggravating or relieving factors.  She has had poor oral intake due to her symptoms.  Per her daughter she has been unable to keep any food or liquid down in over 1 week.  She denies NSAID use but is on Eliquis for her A. Fib. She denies having any  fever or chills, no diarrhea, no diaphoresis, no palpitations. Labs show sodium 123, potassium 4.8, chloride 89, bicarb 25, glucose 126, BUN 18, creatinine 1.1, calcium 9.3, magnesium 2.0, alkaline phosphatase 72, albumin 4.1, lipase 32, AST 31, ALT 15, total protein 6.7, troponin 30 >> 31, white count 13, hemoglobin 11, hematocrit 31, MCV 91, RDW 12.1, platelet count 395, PT 17.9, INR 1.5 CT scan of abdomen and pelvis shows no acute finding.  No bowel obstruction or wall thickening. Cholelithiasis. Aortic Atherosclerosis . CT angiogram of the chest shows no evidence of pulmonary emboli.  Persistent but improved patchy airspace opacities consistent with Covid pneumonia. CT scan of the head without contrast shows mild atrophic changes without acute abnormality Chest x-ray reviewed by me shows patchy airspace opacity in the right upper lung and retrocardiac left base. Imaging features compatible with multifocal pneumonia. Follow-up imaging recommended to ensure resolution Twelve-lead EKG reviewed by me shows sinus rhythm with first-degree AV block  ED Course: Patient is an 86 year old female who presents to the emergency room for evaluation of generalized weakness, nausea and coffee-ground emesis.  Patient recently had COVID-19 pneumonia and is on antibiotic therapy for presumed superimposed bacterial pneumonia.  She is found to have a sodium of 123.  Patient also had significant elevated blood pressure in the ER and received clonidine and 2 doses of IV hydralazine with improvement in her blood pressure.  She will be admitted to the hospital for further evaluation.    Review of Systems: As per HPI  otherwise all systems reviewed and negative.    Past Medical History:  Diagnosis Date  . Anemia   . 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 CHF (congestive heart failure) ([place])   . Chronic back pain   . Chronic kidney disease   . Coronary artery disease   . Dysrhythmia    A-fib, ventricular ectopy  with palpitations  . Fibrocystic breast disease   . GERD (gastroesophageal reflux disease)   . Heart murmur   . Hyperlipidemia   . Hypertension   . Ischemic colitis ([place])   . Myocardial infarction ([place])   . Obesity   . Peptic ulcer disease   . Renovascular hypertension   . Thyroid disease   . Venous stasis     Past Surgical History:  Procedure Laterality Date  . ABDOMINAL HYSTERECTOMY    . CARDIAC ELECTROPHYSIOLOGY MAPPING AND ABLATION    . COLONOSCOPY WITH PROPOFOL N/A 11/26/2018   Procedure: COLONOSCOPY WITH PROPOFO;  Surgeon: Lollie Sails, MD;  Location: [facility] ENDOSCOPY;  Service: Endoscopy;  Laterality: N/A;  . ESOPHAGOGASTRODUODENOSCOPY (EGD) WITH PROPOFOL N/A 04/28/2019   Procedure: ESOPHAGOGASTRODUODENOSCOPY (EGD) WITH PROPOFOL;  Surgeon: Robert Bellow, MD;  Location: [facility] ENDOSCOPY;  Service: Endoscopy;  Laterality: N/A;  . GIVENS CAPSULE STUDY N/A 05/06/2019   Procedure: GIVENS CAPSULE STUDY;  Surgeon: Robert Bellow, MD;  [facility] ENDOSCOPY;  Service: Endoscopy;  Laterality: N/A;  . HAND DEBRIDEMENT Left 11/2015   Debridement skin/SQ tissue, wrist/hand/finger  . MASTECTOMY Bilateral   . radial artery repair Left 11/2015   aneurysm/pseudoaneurysm repair, graft insertion of radial/ulnar artery     reports that she has never smoked. She has never used smokeless tobacco. She reports previous alcohol use. She reports that she does not use drugs.  Allergies  Allergen Reactions  . Amlodipine   . Codeine   . Cortisone   . Hydrochlorothiazide Other (See Comments)    Confusion  . Lisinopril   . Phenergan [Promethazine]   . Prednisone   . Spironolactone   . Tikosyn [Dofetilide]     History reviewed. No pertinent family history.   Prior to Admission medications   Medication Sig Start Date End Date Taking? Authorizing Provider  acetaminophen (TYLENOL) 500 MG tablet Take 500-1,000 mg by mouth every 6 (six) hours as needed for mild pain or fever.   Yes  [provider]  ALPRAZolam (XANAX) 0.25 MG tablet Take 0.25 mg by mouth 3 (three) times daily as needed for anxiety or sleep.   Yes [provider]  apixaban (ELIQUIS) 5 MG TABS tablet Take 5 mg by mouth every 12 (twelve) hours.   Yes [provider]  carvedilol (COREG) 25 MG tablet Take 25 mg by mouth every 12 (twelve) hours.   Yes [provider]  cloNIDine (CATAPRES) 0.1 MG tablet Take 0.1-0.3 mg by mouth See admin instructions. Take 1 tablet (0.1mg ) by mouth every morning, take 2 tablets (0.2mg ) by mouth at lunchtime and take 3 tablets (0.3mg ) by mouth every night   Yes [provider]  docusate sodium (COLACE) 100 MG capsule Take 100 mg by mouth daily.   Yes [provider]  furosemide (LASIX) 40 MG tablet Take 40 mg by mouth See admin instructions. Take 1 tablet (40mg ) by mouth daily -- take 1 additional tablet (40mg ) daily for excessive fluid gain   Yes [provider]  gabapentin (NEURONTIN) 300 MG capsule Take 300 mg by mouth at bedtime.   Yes [provider]  hydrALAZINE (APRESOLINE) 50 MG tablet Take 50 mg by mouth 3 (three) times daily.   Yes [provider]  lansoprazole (PREVACID) 15 MG capsule Take 15 mg by mouth 2 (two) times daily.   Yes [provider]  levothyroxine (SYNTHROID) 75 MCG tablet Take 75 mcg by mouth daily before breakfast. Take qd on empty stomach with a glass of water at least 30-60 minutes before breakfast   Yes [provider]  linaclotide (LINZESS) 290 MCG CAPS capsule Take 290 mcg by mouth daily. 05/07/19  Yes [provider]  losartan (COZAAR) 100 MG tablet Take 100 mg by mouth at bedtime.   Yes [provider]  magnesium oxide (MAG-OX) 400 MG tablet Take 400 mg by mouth 2 (two) times daily. 03/02/20  Yes [provider]  metoCLOPramide (REGLAN) 5 MG tablet Take 5 mg by mouth 3 (three) times daily before meals.   Yes [provider]   ondansetron (ZOFRAN) 4 MG tablet Take 4 mg by mouth every 8 (eight) hours as needed for nausea/vomiting. 03/09/20  Yes [provider]  OVER THE COUNTER MEDICATION Take 1 tablet by mouth as directed. Legatrin  Acetaminophen/diphenhydramine (500/50)   Yes [provider]  pravastatin (PRAVACHOL) 80 MG tablet Take 80 mg by mouth at bedtime.   Yes [provider]  prazosin (MINIPRESS) 1 MG capsule Take 2 mg by mouth 2 (two) times daily. 05/25/19  Yes [provider]  verapamil (CALAN-SR) 180 MG CR tablet Take 180 mg by mouth daily with lunch.   Yes [provider]    Physical Exam: Vitals:   03/10/20 1200 03/10/20 1230 03/10/20 1430 03/10/20 1516  BP: (!) 205/57 (!) 153/62 (!) 197/65 (!) 203/67  Pulse: 91 87 90   Resp:  19 18   Temp:      TempSrc:      SpO2: 97% 97% 96%   Weight:      Height:         Vitals:   03/10/20 1200 03/10/20 1230 03/10/20 1430 03/10/20 1516  BP: (!) 205/57 (!) 153/62 (!) 197/65 (!) 203/67  Pulse: 91 87 90   Resp:  19 18   Temp:      TempSrc:      SpO2: 97% 97% 96%   Weight:      Height:        Constitutional: NAD, alert and oriented x 3.  Appears very weak and tired Eyes: PERRL, lids and conjunctivae normal ENMT: Mucous membranes are dry.  Neck: normal, supple, no masses, no thyromegaly Respiratory: clear to auscultation bilaterally, no wheezing, no crackles. Normal respiratory effort. No accessory muscle use.  Cardiovascular: Regular rate and rhythm, no murmurs / rubs / gallops. No extremity edema. 2+ pedal pulses. No carotid bruits.  Abdomen: Epigastric tenderness, no masses palpated. No hepatosplenomegaly. Bowel sounds positive.  Musculoskeletal: no clubbing / cyanosis. No joint deformity upper and lower extremities.  Skin: no rashes, lesions, ulcers.  Neurologic: No gross focal neurologic deficit.  Generalized weakness Psychiatric: Normal mood and affect.   Labs on Admission: I have personally  reviewed following labs and imaging studies  CBC: Recent Labs  Lab 03/10/20 0617  WBC 13.0*  HGB 11.0*  HCT 31.4*  MCV 91.0  PLT [id number]   Basic Metabolic Panel: Recent Labs  Lab 03/10/20 0617 03/10/20 0755  NA 123*  --   K 4.8  --   CL 89*  --   CO2 25  --   GLUCOSE 126*  --  BUN 18  --   CREATININE 1.11*  --   CALCIUM 9.3  --   MG  --  2.0   GFR: Estimated Creatinine Clearance: 36.8 mL/min (A) (by C-G formula based on SCr of 1.11 mg/dL (H)). Liver Function Tests: Recent Labs  Lab 03/10/20 0617  AST 31  ALT 15  ALKPHOS 72  BILITOT 0.9  PROT 6.7  ALBUMIN 4.1   Recent Labs  Lab 03/10/20 0617  LIPASE 32   No results for input(s): AMMONIA in the last 168 hours. Coagulation Profile: Recent Labs  Lab 03/10/20 0651  INR 1.5*   Cardiac Enzymes: No results for input(s): CKTOTAL, CKMB, CKMBINDEX, TROPONINI in the last 168 hours. BNP (last 3 results) No results for input(s): PROBNP in the last 8760 hours. HbA1C: No results for input(s): HGBA1C in the last 72 hours. CBG: No results for input(s): GLUCAP in the last 168 hours. Lipid Profile: No results for input(s): CHOL, HDL, LDLCALC, TRIG, CHOLHDL, LDLDIRECT in the last 72 hours. Thyroid Function Tests: No results for input(s): TSH, T4TOTAL, FREET4, T3FREE, THYROIDAB in the last 72 hours. Anemia Panel: No results for input(s): VITAMINB12, FOLATE, FERRITIN, TIBC, IRON, RETICCTPCT in the last 72 hours. Urine analysis:    Component Value Date/Time   COLORURINE YELLOW (A) 03/10/2020 0609   APPEARANCEUR CLEAR (A) 03/10/2020 0609   LABSPEC 1.033 (H) 03/10/2020 0609   PHURINE 5.0 03/10/2020 0609   GLUCOSEU NEGATIVE 03/10/2020 0609   HGBUR NEGATIVE 03/10/2020 0609   BILIRUBINUR NEGATIVE 03/10/2020 0609   KETONESUR NEGATIVE 03/10/2020 0609   PROTEINUR NEGATIVE 03/10/2020 0609   NITRITE NEGATIVE 03/10/2020 0609   LEUKOCYTESUR NEGATIVE 03/10/2020 0609    Radiological Exams on Admission: DG Chest 2  View  Result Date: 03/10/2020 CLINICAL DATA:  Shortness of breath and chest pain. EXAM: CHEST - 2 VIEW COMPARISON:  03/31/2013 FINDINGS: Cardiopericardial silhouette is at upper limits of normal for size. Patchy airspace disease is seen in the right upper lung and retrocardiac left base. No substantial pleural effusion. The visualized bony structures of the thorax show no acute abnormality. IMPRESSION: Patchy airspace opacity in the right upper lung and retrocardiac left base. Imaging features compatible with multifocal pneumonia. Follow-up imaging recommended to ensure resolution. Electronically Signed   By: Kennith CenterEric  Mansell M.D.   On: 03/10/2020 07:12   CT Head Wo Contrast  Result Date: 03/10/2020 CLINICAL DATA:  Headaches with nausea and vomiting EXAM: CT HEAD WITHOUT CONTRAST TECHNIQUE: Contiguous axial images were obtained from the base of the skull through the vertex without intravenous contrast. COMPARISON:  None. FINDINGS: Brain: Mild atrophic changes are noted. No findings to suggest acute hemorrhage, acute infarction or space-occupying mass lesion are seen. Vascular: No hyperdense vessel or unexpected calcification. Skull: Normal. Negative for fracture or focal lesion. Sinuses/Orbits: No acute finding. Other: None. IMPRESSION: Mild atrophic changes without acute abnormality. Electronically Signed   By: Alcide CleverMark  Lukens M.D.   On: 03/10/2020 10:29   CT Angio Chest PE W and/or Wo Contrast  Result Date: 03/10/2020 CLINICAL DATA:  Chest pain and shortness of breath 75 mL Omnipaque 350. EXAM: CT ANGIOGRAPHY CHEST WITH CONTRAST TECHNIQUE: Multidetector CT imaging of the chest was performed using the standard protocol during bolus administration of intravenous contrast. Multiplanar CT image reconstructions and MIPs were obtained to evaluate the vascular anatomy. CONTRAST:  75mL OMNIPAQUE IOHEXOL 350 MG/ML SOLN COMPARISON:  Chest x-ray from earlier in the same day. FINDINGS: Cardiovascular: Thoracic aorta  demonstrates atherosclerotic calcifications without aneurysmal dilatation. Heart is at  the upper limits of normal in size. No coronary calcifications are noted. The pulmonary artery shows a normal branching pattern. No focal filling defect is identified to suggest pulmonary embolism. Mediastinum/Nodes: Thoracic inlet is within normal limits. No hilar or mediastinal adenopathy is noted. The esophagus as visualized is within normal limits. Lungs/Pleura: Lungs are well aerated bilaterally. Patchy ground-glass opacities are noted similar to that seen on prior CT examination but slightly improved. No new focal infiltrate or sizable effusion is seen. No parenchymal nodules are noted. Upper Abdomen: Visualized upper abdomen shows no acute abnormality. Musculoskeletal: Bilateral partially calcified breast implants are noted. Degenerative changes of the thoracic spine are noted. No acute bony abnormality is noted. Review of the MIP images confirms the above findings. IMPRESSION: No evidence of pulmonary emboli. Persistent but improved patchy airspace opacities consistent with the given clinical history of prior COVID-19 infection. No new focal abnormality is noted. Aortic Atherosclerosis (ICD10-I70.0). Electronically Signed   By: Inez Catalina M.D.   On: 03/10/2020 10:33   CT ABDOMEN PELVIS W CONTRAST  Result Date: 03/10/2020 CLINICAL DATA:  Nausea and vomiting EXAM: CT ABDOMEN AND PELVIS WITH CONTRAST TECHNIQUE: Multidetector CT imaging of the abdomen and pelvis was performed using the standard protocol following bolus administration of intravenous contrast. CONTRAST:  35mL OMNIPAQUE IOHEXOL 350 MG/ML SOLN COMPARISON:  11/11/2018 FINDINGS: Lower chest:  Dedicated chest CT reported separately. Hepatobiliary: No focal liver abnormality.Cholelithiasis. The gallbladder is full but there is no evidence of cholecystitis. No bile duct dilatation. Pancreas: Unremarkable. Spleen: Unremarkable. Adrenals/Urinary Tract: Negative  adrenals. No hydronephrosis or stone. 8 mm left upper pole renal lesion which is greater than water density but not enlarged from 2020 when it had a similar density. No de-enhancement is detected on the delayed phase, favor inconsequential proteinaceous cyst. Unremarkable bladder. Stomach/Bowel: No obstruction. No evidence of bowel inflammation. Minimal wispy density along the anti mesenteric border of the distal transverse colon without underlying diverticulum or discrete epiploic appendage. Vascular/Lymphatic: Diffuse atheromatous calcification of the aorta and coronaries. No mass or adenopathy. Reproductive:Hysterectomy. Other: No ascites or pneumoperitoneum. Mild presacral fat stranding, likely dependent edema. No neighboring bowel thickening or fracture. Musculoskeletal: No acute abnormalities. IMPRESSION: 1. No acute finding.  No bowel obstruction or wall thickening. 2. Cholelithiasis. 3.  Aortic Atherosclerosis (ICD10-I70.0). Electronically Signed   By: Monte Fantasia M.D.   On: 03/10/2020 10:38    EKG: Independently reviewed.  Sinus rhythm with first-degree AV block  Assessment/Plan Principal Problem:   Hyponatremia Active Problems:   CHF (congestive heart failure) (HCC)   Chronic kidney disease   Thyroid disease   Obesity   Hypertension   GERD (gastroesophageal reflux disease)      Hyponatremia Symptomatic as evidenced by generalized weakness and related to poor oral intake Gentle IV fluid hydration Obtain urine sodium, serum and urine osmolality Repeat electrolytes in a.m. I will consult nephrology if no improvement    Acute gastritis Probably related to recent COVID-19 viral infection Place patient n.p.o. due to persistent nausea Place patient on scheduled Reglan and IV PPI    Hypertensive urgency with complications of stage III chronic kidney disease Patient's blood pressure significantly elevated because she has been unable to take any of her antihypertensives due to  persistent nausea and vomiting She received a dose of clonidine in the ER as well as 2 doses of IV hydralazine She may need to be started on IV drip to optimize blood pressure control    Hypothyroidism Resume Synthroid once patient is able  to tolerate p.o.    Anxiety disorder Continue as needed Xanax    Paroxysmal A. Fib Continue carvedilol for rate control Hold apixaban due to concerns of possible GI bleed  DVT prophylaxis: Hold Eliquis due to concerns of GI bleed since patient had episode of coffee-ground emesis Code Status: Full code Family Communication: Greater than 50% of time was spent discussing plan of care with patient and her daughter at the bedside.  All questions and concerns have been addressed.  Verbalized understanding and agree with the plan. Disposition Plan: Back to previous home environment Consults called: None    Randal Yepiz MD [facility]     03/10/2020, 3:41 PM

## 2020-03-10 NOTE — ED Provider Notes (Signed)
[facility] Emergency Department Provider Note   ____________________________________________   Event Date/Time   First MD Initiated Contact with Patient 03/10/20 [phone]     (approximate)  I have reviewed the triage vital signs and the nursing notes.   HISTORY  Chief Complaint Nausea, Vomiting, Shortness of Breath, and Hematemesis    HPI Andrea Haley is a 86 y.o. female with a recent history of COVID-19, pneumonia and urinary tract infection  Patient is here with her daughter, reports that she has just been slowly worsening ever since having Covid but the last several days been pretty rough.  Last night she was nauseated could not keep any of her medications on her stomach and vomited a couple of times what look like dark kind of coffee like vomit.  Also having some mild upper abdominal pain more in the mid to left upper abdomen.  She is also had a slight residual cough and fatigue.  She has been very weak and very tired.  They have been seeing her doctor including Dr. Doy Hutching, and she is currently on Levaquin as well as supposed to start Bactrim for another for UTI and pneumonia after Covid  She reports mild pain reports pretty minimal does not wish for any pain medicine but very nauseated.  Reports Zofran has been helpful  No black or bloody stools.  Does have a history of GI bleeding in the past from an unknown cause and continues to take her Eliquis  Has had no falls or injuries.  Reports a moderate headache over the last day as well has not been able to take any of her blood pressure medicine   Past Medical History:  Diagnosis Date  . Anemia   . 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 CHF (congestive heart failure) ([place])   . Chronic back pain   . Chronic kidney disease   . Coronary artery disease   . Dysrhythmia    A-fib, ventricular ectopy with palpitations  . Fibrocystic breast disease   . GERD (gastroesophageal reflux disease)   . Heart  murmur   . Hyperlipidemia   . Hypertension   . Ischemic colitis ([place])   . Myocardial infarction ([place])   . Obesity   . Peptic ulcer disease   . Renovascular hypertension   . Thyroid disease   . Venous stasis     Patient Active Problem List   Diagnosis Date Noted  . Iron deficiency anemia 05/14/2019    Past Surgical History:  Procedure Laterality Date  . ABDOMINAL HYSTERECTOMY    . CARDIAC ELECTROPHYSIOLOGY MAPPING AND ABLATION    . COLONOSCOPY WITH PROPOFOL N/A 11/26/2018   Procedure: COLONOSCOPY WITH PROPOFO;  Surgeon: Lollie Sails, MD;  Location: [facility] ENDOSCOPY;  Service: Endoscopy;  Laterality: N/A;  . ESOPHAGOGASTRODUODENOSCOPY (EGD) WITH PROPOFOL N/A 04/28/2019   Procedure: ESOPHAGOGASTRODUODENOSCOPY (EGD) WITH PROPOFOL;  Surgeon: Robert Bellow, MD;  Location: [facility] ENDOSCOPY;  Service: Endoscopy;  Laterality: N/A;  . GIVENS CAPSULE STUDY N/A 05/06/2019   Procedure: GIVENS CAPSULE STUDY;  Surgeon: Robert Bellow, MD;  Location: [facility] ENDOSCOPY;  Service: Endoscopy;  Laterality: N/A;  . HAND DEBRIDEMENT Left 11/2015   Debridement skin/SQ tissue, wrist/hand/finger  . MASTECTOMY Bilateral   . radial artery repair Left 11/2015   aneurysm/pseudoaneurysm repair, graft insertion of radial/ulnar artery    Prior to Admission medications   Medication Sig Start Date End Date Taking? Authorizing Provider  acetaminophen (TYLENOL) 500 MG tablet Take 500-1,000 mg by  mouth every 6 (six) hours as needed for mild pain or fever.   Yes [provider]  ALPRAZolam (XANAX) 0.25 MG tablet Take 0.25 mg by mouth 3 (three) times daily as needed for anxiety or sleep.   Yes [provider]  apixaban (ELIQUIS) 5 MG TABS tablet Take 5 mg by mouth every 12 (twelve) hours.   Yes [provider]  carvedilol (COREG) 25 MG tablet Take 25 mg by mouth every 12 (twelve) hours.   Yes [provider]  cloNIDine (CATAPRES) 0.1 MG tablet Take 0.1-0.3 mg by mouth See  admin instructions. Take 1 tablet (0.1mg ) by mouth every morning, take 2 tablets (0.2mg ) by mouth at lunchtime and take 3 tablets (0.3mg ) by mouth every night   Yes [provider]  docusate sodium (COLACE) 100 MG capsule Take 100 mg by mouth daily.   Yes [provider]  furosemide (LASIX) 40 MG tablet Take 40 mg by mouth See admin instructions. Take 1 tablet (40mg ) by mouth daily -- take 1 additional tablet (40mg ) daily for excessive fluid gain   Yes [provider]  gabapentin (NEURONTIN) 300 MG capsule Take 300 mg by mouth at bedtime.   Yes [provider]  hydrALAZINE (APRESOLINE) 50 MG tablet Take 50 mg by mouth 3 (three) times daily.   Yes [provider]  lansoprazole (PREVACID) 15 MG capsule Take 15 mg by mouth 2 (two) times daily.   Yes [provider]  levothyroxine (SYNTHROID) 75 MCG tablet Take 75 mcg by mouth daily before breakfast. Take qd on empty stomach with a glass of water at least 30-60 minutes before breakfast   Yes [provider]  linaclotide (LINZESS) 290 MCG CAPS capsule Take 290 mcg by mouth daily. 05/07/19  Yes [provider]  losartan (COZAAR) 100 MG tablet Take 100 mg by mouth at bedtime.   Yes [provider]  magnesium oxide (MAG-OX) 400 MG tablet Take 400 mg by mouth 2 (two) times daily. 03/02/20  Yes [provider]  metoCLOPramide (REGLAN) 5 MG tablet Take 5 mg by mouth 3 (three) times daily before meals.   Yes [provider]  ondansetron (ZOFRAN) 4 MG tablet Take 4 mg by mouth every 8 (eight) hours as needed for nausea/vomiting. 03/09/20  Yes [provider]  OVER THE COUNTER MEDICATION Take 1 tablet by mouth as directed. Legatrin  Acetaminophen/diphenhydramine (500/50)   Yes [provider]  pravastatin (PRAVACHOL) 80 MG tablet Take 80 mg by mouth at bedtime.   Yes [provider]  prazosin (MINIPRESS) 1 MG capsule Take 2 mg by mouth 2  (two) times daily. 05/25/19  Yes [provider]  verapamil (CALAN-SR) 180 MG CR tablet Take 180 mg by mouth daily with lunch.   Yes [provider]    Allergies Amlodipine, Codeine, Cortisone, Hydrochlorothiazide, Lisinopril, Phenergan [promethazine], Prednisone, Spironolactone, and Tikosyn [dofetilide]  History reviewed. No pertinent family history.  Social History Social History   Tobacco Use  . Smoking status: Never Smoker  . Smokeless tobacco: Never Used  Vaping Use  . Vaping Use: Never used  Substance Use Topics  . Alcohol use: Not Currently    Comment: occasional wine qhs  . Drug use: Never    Review of Systems Constitutional: No fever/chills Eyes: No visual changes. ENT: No sore throat. Cardiovascular: Denies chest pain. Respiratory: Denies shortness of breath.  Residual coughing Gastrointestinal: See HPI Genitourinary: Negative for dysuria. Musculoskeletal: Negative for back pain. Skin: Negative for rash.  Neurological: Negative for areas of focal weakness or numbness.    ____________________________________________   PHYSICAL EXAM:  VITAL SIGNS: ED Triage Vitals  Enc Vitals Group     BP 03/10/20 0606 (!) 235/88     Pulse Rate 03/10/20 0606 89     Resp 03/10/20 0606 18     Temp 03/10/20 0606 98.7 F (37.1 C)     Temp Source 03/10/20 0606 Oral     SpO2 03/10/20 0606 94 %     Weight 03/10/20 0607 164 lb (74.4 kg)     Height 03/10/20 0607 5\' 5"  (1.651 m)     Head Circumference --      Peak Flow --      Pain Score 03/10/20 0606 0     Pain Loc --      Pain Edu? --      Excl. in [place]? --     Constitutional: Alert and oriented.  Mildly ill-appearing with regard to fatigue but no acute distress.  95% oxygenation on room air Eyes: Conjunctivae are normal. Head: Atraumatic. Nose: No congestion/rhinnorhea. Mouth/Throat: Mucous membranes are quite dry. Neck: No stridor.  Cardiovascular: Normal rate, regular rhythm. Grossly normal heart  sounds.  Good peripheral circulation. Respiratory: Normal respiratory effort.  No retractions. Lungs CTAB. Gastrointestinal: Soft and nontender except reports mild tenderness in epigastrium somewhat left upper quadrant and a little bit in the right upper quadrant but not a negative Murphy. No distention. Musculoskeletal: No lower extremity tenderness nor edema. Neurologic:  Normal speech and language. No gross focal neurologic deficits are appreciated.  Skin:  Skin is warm, dry and intact. No rash noted. Psychiatric: Mood and affect are normal. Speech and behavior are normal.  ____________________________________________   LABS (all labs ordered are listed, but only abnormal results are displayed)  Labs Reviewed  COMPREHENSIVE METABOLIC PANEL - Abnormal; Notable for the following components:      Result Value   Sodium 123 (*)    Chloride 89 (*)    Glucose, Bld 126 (*)    Creatinine, Ser 1.11 (*)    GFR, Estimated 48 (*)    All other components within normal limits  CBC - Abnormal; Notable for the following components:   WBC 13.0 (*)    RBC 3.45 (*)    Hemoglobin 11.0 (*)    HCT 31.4 (*)    All other components within normal limits  PROTIME-INR - Abnormal; Notable for the following components:   Prothrombin Time 17.9 (*)    INR 1.5 (*)    All other components within normal limits  TROPONIN I (HIGH SENSITIVITY) - Abnormal; Notable for the following components:   Troponin I (High Sensitivity) 30 (*)    All other components within normal limits  TROPONIN I (HIGH SENSITIVITY) - Abnormal; Notable for the following components:   Troponin I (High Sensitivity) 31 (*)    All other components within normal limits  CULTURE, BLOOD (ROUTINE X 2)  CULTURE, BLOOD (ROUTINE X 2)  LIPASE, BLOOD  MAGNESIUM  URINALYSIS, COMPLETE (UACMP) WITH MICROSCOPIC  URINALYSIS, ROUTINE W REFLEX MICROSCOPIC  POC OCCULT BLOOD, ED  TYPE AND SCREEN  ABO/RH    ____________________________________________  EKG  Reviewed interpreted by me at 6 AM Heart rate 85 QRS 60 QTc 450 Normal sinus rhythm, probable old septal infarct.  No evidence of acute ischemia denoted ____________________________________________  RADIOLOGY  DG Chest 2 View  Result Date: 03/10/2020 CLINICAL DATA:  Shortness of breath and chest pain. EXAM: CHEST - 2  VIEW COMPARISON:  03/31/2013 FINDINGS: Cardiopericardial silhouette is at upper limits of normal for size. Patchy airspace disease is seen in the right upper lung and retrocardiac left base. No substantial pleural effusion. The visualized bony structures of the thorax show no acute abnormality. IMPRESSION: Patchy airspace opacity in the right upper lung and retrocardiac left base. Imaging features compatible with multifocal pneumonia. Follow-up imaging recommended to ensure resolution. Electronically Signed   By: Misty Stanley M.D.   On: 03/10/2020 07:12   CT Head Wo Contrast  Result Date: 03/10/2020 CLINICAL DATA:  Headaches with nausea and vomiting EXAM: CT HEAD WITHOUT CONTRAST TECHNIQUE: Contiguous axial images were obtained from the base of the skull through the vertex without intravenous contrast. COMPARISON:  None. FINDINGS: Brain: Mild atrophic changes are noted. No findings to suggest acute hemorrhage, acute infarction or space-occupying mass lesion are seen. Vascular: No hyperdense vessel or unexpected calcification. Skull: Normal. Negative for fracture or focal lesion. Sinuses/Orbits: No acute finding. Other: None. IMPRESSION: Mild atrophic changes without acute abnormality. Electronically Signed   By: Inez Catalina M.D.   On: 03/10/2020 10:29   CT Angio Chest PE W and/or Wo Contrast  Result Date: 03/10/2020 CLINICAL DATA:  Chest pain and shortness of breath 75 mL Omnipaque 350. EXAM: CT ANGIOGRAPHY CHEST WITH CONTRAST TECHNIQUE: Multidetector CT imaging of the chest was performed using the standard protocol  during bolus administration of intravenous contrast. Multiplanar CT image reconstructions and MIPs were obtained to evaluate the vascular anatomy. CONTRAST:  108mL OMNIPAQUE IOHEXOL 350 MG/ML SOLN COMPARISON:  Chest x-ray from earlier in the same day. FINDINGS: Cardiovascular: Thoracic aorta demonstrates atherosclerotic calcifications without aneurysmal dilatation. Heart is at the upper limits of normal in size. No coronary calcifications are noted. The pulmonary artery shows a normal branching pattern. No focal filling defect is identified to suggest pulmonary embolism. Mediastinum/Nodes: Thoracic inlet is within normal limits. No hilar or mediastinal adenopathy is noted. The esophagus as visualized is within normal limits. Lungs/Pleura: Lungs are well aerated bilaterally. Patchy ground-glass opacities are noted similar to that seen on prior CT examination but slightly improved. No new focal infiltrate or sizable effusion is seen. No parenchymal nodules are noted. Upper Abdomen: Visualized upper abdomen shows no acute abnormality. Musculoskeletal: Bilateral partially calcified breast implants are noted. Degenerative changes of the thoracic spine are noted. No acute bony abnormality is noted. Review of the MIP images confirms the above findings. IMPRESSION: No evidence of pulmonary emboli. Persistent but improved patchy airspace opacities consistent with the given clinical history of prior COVID-19 infection. No new focal abnormality is noted. Aortic Atherosclerosis (ICD10-I70.0). Electronically Signed   By: Inez Catalina M.D.   On: 03/10/2020 10:33   CT ABDOMEN PELVIS W CONTRAST  Result Date: 03/10/2020 CLINICAL DATA:  Nausea and vomiting EXAM: CT ABDOMEN AND PELVIS WITH CONTRAST TECHNIQUE: Multidetector CT imaging of the abdomen and pelvis was performed using the standard protocol following bolus administration of intravenous contrast. CONTRAST:  43mL OMNIPAQUE IOHEXOL 350 MG/ML SOLN COMPARISON:  11/11/2018  FINDINGS: Lower chest:  Dedicated chest CT reported separately. Hepatobiliary: No focal liver abnormality.Cholelithiasis. The gallbladder is full but there is no evidence of cholecystitis. No bile duct dilatation. Pancreas: Unremarkable. Spleen: Unremarkable. Adrenals/Urinary Tract: Negative adrenals. No hydronephrosis or stone. 8 mm left upper pole renal lesion which is greater than water density but not enlarged from 2020 when it had a similar density. No de-enhancement is detected on the delayed phase, favor inconsequential proteinaceous cyst. Unremarkable bladder. Stomach/Bowel: No obstruction. No  evidence of bowel inflammation. Minimal wispy density along the anti mesenteric border of the distal transverse colon without underlying diverticulum or discrete epiploic appendage. Vascular/Lymphatic: Diffuse atheromatous calcification of the aorta and coronaries. No mass or adenopathy. Reproductive:Hysterectomy. Other: No ascites or pneumoperitoneum. Mild presacral fat stranding, likely dependent edema. No neighboring bowel thickening or fracture. Musculoskeletal: No acute abnormalities. IMPRESSION: 1. No acute finding.  No bowel obstruction or wall thickening. 2. Cholelithiasis. 3.  Aortic Atherosclerosis (ICD10-I70.0). Electronically Signed   By: Monte Fantasia M.D.   On: 03/10/2020 10:38     Imaging studies reviewed.  CT of the chest negative for acute pulmonary embolism.  Persistent opacities are noted.  Please see full details as above.  CT head negative for acute, CT abdomen pelvis no acute findings ____________________________________________   PROCEDURES  Procedure(s) performed: None  Procedures  Critical Care performed: Yes, see critical care note(s)  CRITICAL CARE Performed by: Delman Kitten   Total critical care time: 40 minutes  Critical care time was exclusive of separately billable procedures and treating other patients.  Critical care was necessary to treat or prevent imminent or  life-threatening deterioration.  Critical care was time spent personally by me on the following activities: development of treatment plan with patient and/or surrogate as well as nursing, discussions with consultants, evaluation of patient's response to treatment, examination of patient, obtaining history from patient or surrogate, ordering and performing treatments and interventions, ordering and review of laboratory studies, ordering and review of radiographic studies, pulse oximetry and re-evaluation of patient's condition.  ____________________________________________   INITIAL IMPRESSION / ASSESSMENT AND PLAN / ED COURSE  Pertinent labs & imaging results that were available during my care of the patient were reviewed by me and considered in my medical decision making (see chart for details).   Presents for fatigue weakness, no appetite vomiting of coffee-ground like emesis earlier today.  Her's pulmonary symptoms seem to be improving should been on Levaquin after Covid treatment for possible pneumonia as well as evidently a possible UTI.  She has severe hypertension associated with a headache and nausea, also consider hypertensive urgency as a potential etiology, borderline emergency given how high her blood pressure is.  Normally takes multiple blood pressure medications but unable to keep them on her stomach last night and yesterday    ----------------------------------------- 10:40 AM on 03/10/2020 -----------------------------------------  Patient continues to have severe hypertension, blood pressure [id number] systolic on recheck now after hydralazine.  Her nausea is slightly improving, will trial oral clonidine and if this is not effective potentially consider use of a clonidine patch after having's discussed options with the pharmacist.  Also additional hydralazine ordered.  No noted fever.  Mild leukocytosis.  Imaging of the chest reviewed, somewhat reassuring that her infiltrates are  likely improving.  I ordered blood cultures, patient unable to tolerate oral medication will order IV Levaquin which she has been taking orally but could not tolerate yesterday.  ----------------------------------------- 1:57 PM on 03/10/2020 -----------------------------------------  Patient improving.  Resting comfortably at this time.  Admission discussed with Dr. Francine Graven.  Patient and family agreeable with plan for admission.  Discussed with the hospitalist and understand patient with multiple issues at this time requiring further work-up. ____________________________________________   FINAL CLINICAL IMPRESSION(S) / ED DIAGNOSES  Final diagnoses:  Non-intractable vomiting with nausea, unspecified vomiting type  Hyponatremia  Possible persistent pneumonia Possible GI bleeding      Note:  This document was prepared using Dragon voice recognition software and may include unintentional dictation  errors       Delman Kitten, MD 03/10/20 1358

## 2020-03-10 NOTE — Progress Notes (Deleted)
°   03/10/20 2141  Assess: MEWS Score  Temp 99 F (37.2 C)  BP (!) 214/61  Pulse Rate 92  Resp 16  SpO2 96 %  O2 Device Room Air  Assess: MEWS Score  MEWS Temp 0  MEWS Systolic 2  MEWS Pulse 0  MEWS RR 0  MEWS LOC 0  MEWS Score 2  MEWS Score Color Yellow  Treat  Pain Scale 0-10  Pain Score 0  Take Vital Signs  Increase Vital Sign Frequency  Yellow: Q 2hr X 2 then Q 4hr X 2, if remains yellow, continue Q 4hrs  Escalate  MEWS: Escalate Yellow: discuss with charge nurse/RN and consider discussing with provider and RRT  Notify: Charge Nurse/RN  Name of Charge Nurse/RN Notified marcel turner, RN  Date Charge Nurse/RN Notified 03/10/20  Time Charge Nurse/RN Notified 2200

## 2020-03-11 DIAGNOSIS — I160 Hypertensive urgency: Secondary | ICD-10-CM

## 2020-03-11 DIAGNOSIS — I10 Essential (primary) hypertension: Secondary | ICD-10-CM

## 2020-03-11 LAB — CBC
HCT: 29.1 % — ABNORMAL LOW (ref 36.0–46.0)
Hemoglobin: 10.1 g/dL — ABNORMAL LOW (ref 12.0–15.0)
MCH: 32.3 pg (ref 26.0–34.0)
MCHC: 34.7 g/dL (ref 30.0–36.0)
MCV: 93 fL (ref 80.0–100.0)
Platelets: 279 10*3/uL (ref 150–400)
RBC: 3.13 MIL/uL — ABNORMAL LOW (ref 3.87–5.11)
RDW: 12.5 % (ref 11.5–15.5)
WBC: 8.4 10*3/uL (ref 4.0–10.5)
nRBC: 0 % (ref 0.0–0.2)

## 2020-03-11 LAB — BASIC METABOLIC PANEL
Anion gap: 9 (ref 5–15)
BUN: 18 mg/dL (ref 8–23)
CO2: 25 mmol/L (ref 22–32)
Calcium: 8.7 mg/dL — ABNORMAL LOW (ref 8.9–10.3)
Chloride: 92 mmol/L — ABNORMAL LOW (ref 98–111)
Creatinine, Ser: 0.93 mg/dL (ref 0.44–1.00)
GFR, Estimated: 60 mL/min — ABNORMAL LOW (ref >60)
Glucose, Bld: 100 mg/dL — ABNORMAL HIGH (ref 70–99)
Potassium: 4.5 mmol/L (ref 3.5–5.1)
Sodium: 126 mmol/L — ABNORMAL LOW (ref 135–145)

## 2020-03-11 LAB — OSMOLALITY: Osmolality: 258 mOsm/kg — ABNORMAL LOW (ref 275–295)

## 2020-03-11 MED ORDER — BISACODYL 10 MG RE SUPP: 10.0000 mg | Freq: Every day | RECTAL | Status: DC | PRN

## 2020-03-11 MED ORDER — ALPRAZOLAM 0.5 MG PO TABS
0.2500 mg | ORAL_TABLET | Freq: Three times a day (TID) | ORAL | Status: DC
  Administered 2020-03-11 – 2020-03-14 (×10): 0.25 mg via ORAL
  Filled 2020-03-11 (×10): qty 1

## 2020-03-11 MED ORDER — SENNOSIDES-DOCUSATE SODIUM 8.6-50 MG PO TABS
1.0000 | ORAL_TABLET | Freq: Two times a day (BID) | ORAL | Status: DC
  Administered 2020-03-11 – 2020-03-14 (×7): 1 via ORAL
  Filled 2020-03-11 (×7): qty 1

## 2020-03-11 MED ORDER — APIXABAN 5 MG PO TABS
5.0000 mg | ORAL_TABLET | Freq: Two times a day (BID) | ORAL | Status: DC
  Administered 2020-03-11 – 2020-03-14 (×7): 5 mg via ORAL
  Filled 2020-03-11 (×7): qty 1

## 2020-03-11 MED ORDER — LORAZEPAM 2 MG/ML IJ SOLN
0.5000 mg | Freq: Four times a day (QID) | INTRAMUSCULAR | Status: DC | PRN
  Administered 2020-03-11 – 2020-03-12 (×2): 0.5 mg via INTRAVENOUS
  Filled 2020-03-11 (×2): qty 1

## 2020-03-11 MED ORDER — LORAZEPAM 2 MG/ML IJ SOLN
0.5000 mg | Freq: Once | INTRAMUSCULAR | Status: AC
  Administered 2020-03-11: 05:00:00 0.5 mg via INTRAVENOUS
  Filled 2020-03-11: qty 1

## 2020-03-11 NOTE — Progress Notes (Addendum)
PROGRESS NOTE    Andrea Haley   [id number]  DOB: November 03, 1931  PCP: Idelle Crouch, MD    DOA: 03/10/2020 LOS: 1   Brief Narrative   Andrea Haley is a 86 y.o. female with medical history significant for recent Covid 19 viral infection(Covid PCR test was positive on 02/18/20) patient had predominantly gastrointestinal symptoms which include nausea, vomiting and anorexia, history of A. fib, coronary artery disease, hypertension, CHF and thyroid disease who presents to the ER via EMS for evaluation of multiple symptoms which include generalized weakness, nausea, vomiting and epigastric abdominal pain.  Pt reportedly has had poor p.o. intake with episodes of nausea vomiting since her Covid diagnosis.  Also with constipation and last BM of over a week prior.  Patient has been unable to keep down food or liquids for over a week.  Evaluation in the ED showed hyponatremia with sodium 123, normal troponin x2, mild leukocytosis 13k.  CT of abdomen pelvis was negative for anything acute.  CTA of the chest negative for PE and showed improved patchy airspace opacities consistent with recent Covid pneumonia.    Admitted to hospitalist service for further evaluation and management.  On IV fluids with improving hyponatremia.     Assessment & Plan   Principal Problem:   Hyponatremia Active Problems:   CHF (congestive heart failure) (HCC)   Chronic kidney disease   Thyroid disease   Obesity   Hypertension   GERD (gastroesophageal reflux disease)   Hypertensive urgency   Hyponatremia - most likely due to poor p.o. intake.   Presented with sodium 123, improved to 126 today with IV fluids.   --Continue IV fluids --Monitor BMP    Acute gastritis -suspect related to recent COVID-19 infection.   Patient tolerating clears and diet advanced to full liquids.   --Advance diet as tolerated.   --Continue IV PPI for now.   --IV Ativan works best for her nausea.     Hypertensive urgency  -present on admission and due to patient's inability to keep down her meds so not getting her antihypertensives.  Now tolerating oral meds and home regimen has been resumed.  As needed hydralazine   Constipation -chronic, no BM for over a week at time of admission.  Resumed on home bowel regimen including Linzess.  Also placed on scheduled senna docusate twice a day.  Dulcolax suppository ordered if needed.  Enema if needed.   Hypothyroidism -continue Synthroid   Generalized anxiety -chronic, stable.  Continue as needed Xanax.   Paroxysmal A. Fib -rate controlled.  Continue Coreg.  Resume Eliquis tomorrow if hemoglobin stable (held for concern of coffee-ground emesis reported on admission)    Patient BMI: Body mass index is 29.3 kg/m.   DVT prophylaxis: SCDs Start: 03/10/20 1450 apixaban (ELIQUIS) tablet 5 mg   Diet:  Diet Orders (From admission, onward)    Start     Ordered   03/11/20 0911  Diet full liquid Room service appropriate? Yes; Fluid consistency: Thin  Diet effective now       Question Answer Comment  Room service appropriate? Yes   Fluid consistency: Thin      03/11/20 0910            Code Status: Full Code    Subjective 03/11/20    Patient seen with daughter at bedside today.  She reports tolerating some clear liquid diet and agrees to try advancing to full's.  Asked about episode of coffee-ground emesis and daughter reports patient  had drinking prune juice before.  Patient getting little relief of nausea with Zofran and Reglan but Ativan has helped.   Disposition Plan & Communication   Status is: Inpatient  Remains inpatient appropriate because:Inpatient level of care appropriate due to severity of illness.  Remains on IV fluids for hyponatremia, still with inadequate oral intake.   Dispo: The patient is from: Home              Anticipated d/c is to: Home              Anticipated d/c date is: 2 days              Patient currently is not medically  stable to d/c.   Family Communication: Daughter was at bedside on rounds today   Consults, Procedures, Significant Events   Consultants:   None  Procedures:   None  Antimicrobials:  Anti-infectives (From admission, onward)   Start     Dose/Rate Route Frequency Ordered Stop   03/10/20 1045  levofloxacin (LEVAQUIN) IVPB 750 mg        750 mg 100 mL/hr over 90 Minutes Intravenous  Once 03/10/20 1042 03/10/20 1351         Objective   Vitals:   03/11/20 0902 03/11/20 1000 03/11/20 1141 03/11/20 1551  BP: (!) 198/70 (!) 167/87 (!) 198/65 (!) 144/53  Pulse: 87 79 82 69  Resp:   17 17  Temp:   98.9 F (37.2 C) 98.6 F (37 C)  TempSrc:   Oral Oral  SpO2: 97% 96% 95% 95%  Weight:      Height:        Intake/Output Summary (Last 24 hours) at 03/11/2020 1604 Last data filed at 03/11/2020 1500 Gross per 24 hour  Intake 1068.96 ml  Output 700 ml  Net 368.96 ml   Filed Weights   03/10/20 0607 03/11/20 0420  Weight: 74.4 kg 79.9 kg    Physical Exam:  General exam: awake, alert, no acute distress HEENT: moist mucus membranes, hearing grossly normal  Respiratory system: CTAB with diminished bases, no wheezes or rhonchi, normal respiratory effort. Cardiovascular system: normal S1/S2, RRR Gastrointestinal system: soft, NT, ND, +bowel sounds. Central nervous system: A&O x3. no gross focal neurologic deficits, normal speech Extremities: moves all, no cyanosis, normal tone Psychiatry: normal mood, congruent affect, judgement and insight appear normal  Labs   Data Reviewed: I have personally reviewed following labs and imaging studies  CBC: Recent Labs  Lab 03/10/20 0617 03/11/20 0541  WBC 13.0* 8.4  HGB 11.0* 10.1*  HCT 31.4* 29.1*  MCV 91.0 93.0  PLT 395 101   Basic Metabolic Panel: Recent Labs  Lab 03/10/20 0617 03/10/20 0755 03/11/20 0541  NA 123*  --  126*  K 4.8  --  4.5  CL 89*  --  92*  CO2 25  --  25  GLUCOSE 126*  --  100*  BUN 18  --  18   CREATININE 1.11*  --  0.93  CALCIUM 9.3  --  8.7*  MG  --  2.0  --    GFR: Estimated Creatinine Clearance: 45.4 mL/min (by C-G formula based on SCr of 0.93 mg/dL). Liver Function Tests: Recent Labs  Lab 03/10/20 0617  AST 31  ALT 15  ALKPHOS 72  BILITOT 0.9  PROT 6.7  ALBUMIN 4.1   Recent Labs  Lab 03/10/20 0617  LIPASE 32   No results for input(s): AMMONIA in the last 168 hours. Coagulation  Profile: Recent Labs  Lab 03/10/20 0651  INR 1.5*   Cardiac Enzymes: No results for input(s): CKTOTAL, CKMB, CKMBINDEX, TROPONINI in the last 168 hours. BNP (last 3 results) No results for input(s): PROBNP in the last 8760 hours. HbA1C: No results for input(s): HGBA1C in the last 72 hours. CBG: No results for input(s): GLUCAP in the last 168 hours. Lipid Profile: No results for input(s): CHOL, HDL, LDLCALC, TRIG, CHOLHDL, LDLDIRECT in the last 72 hours. Thyroid Function Tests: No results for input(s): TSH, T4TOTAL, FREET4, T3FREE, THYROIDAB in the last 72 hours. Anemia Panel: No results for input(s): VITAMINB12, FOLATE, FERRITIN, TIBC, IRON, RETICCTPCT in the last 72 hours. Sepsis Labs: No results for input(s): PROCALCITON, LATICACIDVEN in the last 168 hours.  Recent Results (from the past 240 hour(s))  Culture, blood (Routine X 2) w Reflex to ID Panel     Status: None (Preliminary result)   Collection Time: 03/10/20 10:19 AM   Specimen: BLOOD  Result Value Ref Range Status   Specimen Description BLOOD BLOOD RIGHT WRIST  Final   Special Requests   Final    BOTTLES DRAWN AEROBIC AND ANAEROBIC Blood Culture adequate volume   Culture   Final    NO GROWTH < 24 HOURS Performed at [facility], [address], [place], [place] [address]    Report Status PENDING  Incomplete  Culture, blood (Routine X 2) w Reflex to ID Panel     Status: None (Preliminary result)   Collection Time: 03/10/20 10:19 AM   Specimen: BLOOD  Result Value Ref Range Status   Specimen  Description BLOOD RIGHT ANTECUBITAL  Final   Special Requests   Final    BOTTLES DRAWN AEROBIC AND ANAEROBIC Blood Culture adequate volume   Culture   Final    NO GROWTH < 24 HOURS Performed at [facility], [address], [place], [place] [address]    Report Status PENDING  Incomplete      Imaging Studies   DG Chest 2 View  Result Date: 03/10/2020 CLINICAL DATA:  Shortness of breath and chest pain. EXAM: CHEST - 2 VIEW COMPARISON:  03/31/2013 FINDINGS: Cardiopericardial silhouette is at upper limits of normal for size. Patchy airspace disease is seen in the right upper lung and retrocardiac left base. No substantial pleural effusion. The visualized bony structures of the thorax show no acute abnormality. IMPRESSION: Patchy airspace opacity in the right upper lung and retrocardiac left base. Imaging features compatible with multifocal pneumonia. Follow-up imaging recommended to ensure resolution. Electronically Signed   By: Misty Stanley M.D.   On: 03/10/2020 07:12   CT Head Wo Contrast  Result Date: 03/10/2020 CLINICAL DATA:  Headaches with nausea and vomiting EXAM: CT HEAD WITHOUT CONTRAST TECHNIQUE: Contiguous axial images were obtained from the base of the skull through the vertex without intravenous contrast. COMPARISON:  None. FINDINGS: Brain: Mild atrophic changes are noted. No findings to suggest acute hemorrhage, acute infarction or space-occupying mass lesion are seen. Vascular: No hyperdense vessel or unexpected calcification. Skull: Normal. Negative for fracture or focal lesion. Sinuses/Orbits: No acute finding. Other: None. IMPRESSION: Mild atrophic changes without acute abnormality. Electronically Signed   By: Inez Catalina M.D.   On: 03/10/2020 10:29   CT Angio Chest PE W and/or Wo Contrast  Result Date: 03/10/2020 CLINICAL DATA:  Chest pain and shortness of breath 75 mL Omnipaque 350. EXAM: CT ANGIOGRAPHY CHEST WITH CONTRAST TECHNIQUE: Multidetector CT imaging of  the chest was performed using the standard protocol  during bolus administration of intravenous contrast. Multiplanar CT image reconstructions and MIPs were obtained to evaluate the vascular anatomy. CONTRAST:  57mL OMNIPAQUE IOHEXOL 350 MG/ML SOLN COMPARISON:  Chest x-ray from earlier in the same day. FINDINGS: Cardiovascular: Thoracic aorta demonstrates atherosclerotic calcifications without aneurysmal dilatation. Heart is at the upper limits of normal in size. No coronary calcifications are noted. The pulmonary artery shows a normal branching pattern. No focal filling defect is identified to suggest pulmonary embolism. Mediastinum/Nodes: Thoracic inlet is within normal limits. No hilar or mediastinal adenopathy is noted. The esophagus as visualized is within normal limits. Lungs/Pleura: Lungs are well aerated bilaterally. Patchy ground-glass opacities are noted similar to that seen on prior CT examination but slightly improved. No new focal infiltrate or sizable effusion is seen. No parenchymal nodules are noted. Upper Abdomen: Visualized upper abdomen shows no acute abnormality. Musculoskeletal: Bilateral partially calcified breast implants are noted. Degenerative changes of the thoracic spine are noted. No acute bony abnormality is noted. Review of the MIP images confirms the above findings. IMPRESSION: No evidence of pulmonary emboli. Persistent but improved patchy airspace opacities consistent with the given clinical history of prior COVID-19 infection. No new focal abnormality is noted. Aortic Atherosclerosis (ICD10-I70.0). Electronically Signed   By: Inez Catalina M.D.   On: 03/10/2020 10:33   CT ABDOMEN PELVIS W CONTRAST  Result Date: 03/10/2020 CLINICAL DATA:  Nausea and vomiting EXAM: CT ABDOMEN AND PELVIS WITH CONTRAST TECHNIQUE: Multidetector CT imaging of the abdomen and pelvis was performed using the standard protocol following bolus administration of intravenous contrast. CONTRAST:  89mL  OMNIPAQUE IOHEXOL 350 MG/ML SOLN COMPARISON:  11/11/2018 FINDINGS: Lower chest:  Dedicated chest CT reported separately. Hepatobiliary: No focal liver abnormality.Cholelithiasis. The gallbladder is full but there is no evidence of cholecystitis. No bile duct dilatation. Pancreas: Unremarkable. Spleen: Unremarkable. Adrenals/Urinary Tract: Negative adrenals. No hydronephrosis or stone. 8 mm left upper pole renal lesion which is greater than water density but not enlarged from 2020 when it had a similar density. No de-enhancement is detected on the delayed phase, favor inconsequential proteinaceous cyst. Unremarkable bladder. Stomach/Bowel: No obstruction. No evidence of bowel inflammation. Minimal wispy density along the anti mesenteric border of the distal transverse colon without underlying diverticulum or discrete epiploic appendage. Vascular/Lymphatic: Diffuse atheromatous calcification of the aorta and coronaries. No mass or adenopathy. Reproductive:Hysterectomy. Other: No ascites or pneumoperitoneum. Mild presacral fat stranding, likely dependent edema. No neighboring bowel thickening or fracture. Musculoskeletal: No acute abnormalities. IMPRESSION: 1. No acute finding.  No bowel obstruction or wall thickening. 2. Cholelithiasis. 3.  Aortic Atherosclerosis (ICD10-I70.0). Electronically Signed   By: Monte Fantasia M.D.   On: 03/10/2020 10:38     Medications   Scheduled Meds: . ALPRAZolam  0.25 mg Oral TID  . apixaban  5 mg Oral BID  . carvedilol  25 mg Oral Q12H  . cloNIDine  0.1 mg Oral Daily  . cloNIDine  0.2 mg Oral Daily  . cloNIDine  0.3 mg Oral QHS  . gabapentin  300 mg Oral QHS  . hydrALAZINE  50 mg Oral TID  . levothyroxine  75 mcg Oral QAC breakfast  . linaclotide  290 mcg Oral Daily  . losartan  100 mg Oral QHS  . magnesium oxide  400 mg Oral BID  . pantoprazole (PROTONIX) IV  40 mg Intravenous Q24H  . pravastatin  80 mg Oral q1800  . prazosin  2 mg Oral BID  . senna-docusate  1  tablet Oral BID  . verapamil  180 mg Oral Q lunch   Continuous Infusions: . sodium chloride 75 mL/hr at 03/11/20 0625       LOS: 1 day    Time spent: 30 minutes with greater than 50% spent at bedside and in coordination of care    Ezekiel Slocumb, DO [facility]  03/11/2020, 4:04 PM    If 7PM-7AM, please contact night-coverage. How to contact the [facility] Attending or Consulting provider [place] or covering provider during after hours [place], for this patient?    1. Check the care team in [facility] and look for a) attending/consulting [facility] provider listed and b) the [facility] team listed 2. Log into [email or web address] and use [place] universal password to access. If you do not have the password, please contact the hospital operator. 3. Locate the [facility] provider you are looking for under [facility] and page to a number that you can be directly reached. 4. If you still have difficulty reaching the provider, please page the [facility] (Director on Call) for the Hospitalists listed on amion for assistance.

## 2020-03-11 NOTE — Progress Notes (Addendum)
Pt BP at 209/71 MAP 106 HR 88. Pt BP was rechecked at its at 191/76 MAP 108. Pt also complaints of nausea and mentioned to pt that she have a scheduled Reglan 10 mg IV but does not help with her nausea per pt she prefer ativan IV. Talked to [facility] NP and ordered Ativan 0.5 mg IV once and give clonidine early after an hour. Will continue to monitor.  Update 0621: Pt BP at 175/70 MAP 101 Will administer clonidine now per NP Ouma. Pt also reported that the last BM was on February 28, 2020 Ouma made aware and states to notify incoming shift about the issue for BM. Pt reported that she does have a poor PO intake. Will notify incoming shift. Will continue to monitOR.  Update 0631: Pt Clonidine 0.1 mg was given. Will notify incoming shift. Will continue to monitor.

## 2020-03-11 NOTE — Hospital Course (Addendum)
[name] is a 86 y.o. female with medical history significant for recent Covid 19 viral infection(Covid PCR test was positive on 02/18/20) patient had predominantly gastrointestinal symptoms which include nausea, vomiting and anorexia, history of A. fib, coronary artery disease, hypertension, diastolic CHF and thyroid disease who presents to the ER via EMS for evaluation of multiple symptoms which include generalized weakness, nausea, vomiting and epigastric abdominal pain.  Pt reportedly has had poor p.o. intake with episodes of nausea vomiting since her Covid diagnosis.  Also with constipation and last BM of over a week prior.  Patient has been unable to keep down food or liquids for over a week.  Evaluation in the ED showed hyponatremia with sodium 123, normal troponin x2, mild leukocytosis 13k.  CT of abdomen pelvis was negative for anything acute.  CTA of the chest negative for PE and showed improved patchy airspace opacities consistent with recent Covid pneumonia.    Admitted to hospitalist service for further evaluation and management.

## 2020-03-11 NOTE — Plan of Care (Signed)
°  Problem: Education: Goal: Knowledge of General Education information will improve Description: Including pain rating scale, medication(s)/side effects and non-pharmacologic comfort measures Outcome: Progressing   Problem: Clinical Measurements: Goal: Ability to maintain clinical measurements within normal limits will improve Outcome: Progressing   Problem: Clinical Measurements: Goal: Respiratory complications will improve Outcome: Progressing   Problem: Safety: Goal: Ability to remain free from injury will improve Outcome: Progressing

## 2020-03-12 LAB — BASIC METABOLIC PANEL
Anion gap: 6 (ref 5–15)
BUN: 12 mg/dL (ref 8–23)
CO2: 25 mmol/L (ref 22–32)
Calcium: 8.6 mg/dL — ABNORMAL LOW (ref 8.9–10.3)
Chloride: 93 mmol/L — ABNORMAL LOW (ref 98–111)
Creatinine, Ser: 0.61 mg/dL (ref 0.44–1.00)
GFR, Estimated: >60 mL/min (ref >60)
Glucose, Bld: 98 mg/dL (ref 70–99)
Potassium: 4.2 mmol/L (ref 3.5–5.1)
Sodium: 124 mmol/L — ABNORMAL LOW (ref 135–145)

## 2020-03-12 LAB — CBC
HCT: 27.9 % — ABNORMAL LOW (ref 36.0–46.0)
Hemoglobin: 10.1 g/dL — ABNORMAL LOW (ref 12.0–15.0)
MCH: 33.3 pg (ref 26.0–34.0)
MCHC: 36.2 g/dL — ABNORMAL HIGH (ref 30.0–36.0)
MCV: 92.1 fL (ref 80.0–100.0)
Platelets: 243 10*3/uL (ref 150–400)
RBC: 3.03 MIL/uL — ABNORMAL LOW (ref 3.87–5.11)
RDW: 12.2 % (ref 11.5–15.5)
WBC: 6.4 10*3/uL (ref 4.0–10.5)
nRBC: 0 % (ref 0.0–0.2)

## 2020-03-12 LAB — SODIUM
Sodium: 122 mmol/L — ABNORMAL LOW (ref 135–145)
Sodium: 121 mmol/L — ABNORMAL LOW (ref 135–145)
Sodium: 122 mmol/L — ABNORMAL LOW (ref 135–145)

## 2020-03-12 LAB — TSH: TSH: 0.679 u[IU]/mL (ref 0.350–4.500)

## 2020-03-12 LAB — MAGNESIUM: Magnesium: 2.1 mg/dL (ref 1.7–2.4)

## 2020-03-12 MED ORDER — ONDANSETRON HCL 4 MG PO TABS
4.0000 mg | ORAL_TABLET | Freq: Three times a day (TID) | ORAL | Status: AC
  Administered 2020-03-12 – 2020-03-14 (×6): 4 mg via ORAL
  Filled 2020-03-12 (×6): qty 1

## 2020-03-12 MED ORDER — ENSURE ENLIVE PO LIQD
237.0000 mL | Freq: Two times a day (BID) | ORAL | Status: DC
  Administered 2020-03-12 – 2020-03-13 (×3): 237 mL via ORAL

## 2020-03-12 MED ORDER — SODIUM CHLORIDE 0.9 % IV SOLN: INTRAVENOUS | Status: DC

## 2020-03-12 MED ORDER — HYDRALAZINE HCL 20 MG/ML IJ SOLN
10.0000 mg | INTRAMUSCULAR | Status: DC | PRN
  Administered 2020-03-12: 13:00:00 10 mg via INTRAVENOUS
  Filled 2020-03-12: qty 1

## 2020-03-12 NOTE — Progress Notes (Signed)
Initial Nutrition Assessment  DOCUMENTATION CODES:   Not applicable  INTERVENTION:  Continue Ensure Enlive po BID, each supplement provides 350 kcal and 20 grams of protein (vanilla)  Magic cup BID with meals, each supplement provides 290 kcal and 9 grams of protein   NUTRITION DIAGNOSIS:   Inadequate oral intake related to nausea,vomiting,poor appetite as evidenced by per patient/family report.    GOAL:   Patient will meet greater than or equal to 90% of their needs   MONITOR:   Labs,I & O's,Supplement acceptance,PO intake,Weight trends  REASON FOR ASSESSMENT:   Malnutrition Screening Tool Assessment of nutrition requirement/status  ASSESSMENT:  86 year old female admitted with hyponatremia after presenting with weakness, nausea, coffee-ground emesis, and chest pain. Past medical history significant of recent Covid 19 infection (PCR test positive on 12/3), atrial fibrillation, CAD, HTN, CHF, thyroid disease, GERD, ischemic colitis, chronic back pain, and CKD.  RD working remotely.  Baseline sodium 129-133 in November 2021, presenting sodium of 123 this admission. Nephrology consulted with recommendation of regular diet with consideration of Tolvaptan if it does not correct.   Patient eating 50-100% of meals on full liquids 12/26-12/26. Diet advanced to regular at 1444. Spoke with husband of pt via phone this afternoon, reports pt is sleeping at this time. He endorses pt with poor appetite over the past week associated with nausea and vomiting. She has been drinking Ensure at home and likes vanilla flavor. Husband reports patient consumed most all of her potato soup for lunch today. Patient is ordered Ensure BID per medication review, will continue this as well as provide Magic Cup on lunch and dinner trays to help her meet her needs.  Per chart, weights have been stable over the last 10 months.   Medications reviewed and include: Xanax, Coreg, Catapres, Gabapentin,  Linzess, Mag-ox, Zofran, Protonix, Minipress, Calan-SR  Labs: Na 122 (L)   NUTRITION - FOCUSED PHYSICAL EXAM: Unable to complete at this time  Diet Order:   Diet Order            Diet regular Room service appropriate? Yes; Fluid consistency: Thin  Diet effective now                 EDUCATION NEEDS:   Not appropriate for education at this time  Skin:  Skin Assessment: Reviewed RN Assessment  Last BM:  12/13  Height:   Ht Readings from Last 1 Encounters:  03/10/20 5\' 5"  (1.651 m)    Weight:   Wt Readings from Last 1 Encounters:  03/12/20 80.6 kg   BMI:  Body mass index is 29.57 kg/m.  Estimated Nutritional Needs:   Kcal:  1800-2000  Protein:  90-100  Fluid:  1.8 L   Lajuan Lines, RD, LDN Clinical Nutrition After Hours/Weekend Pager # in [place]

## 2020-03-12 NOTE — Consult Note (Signed)
[address] [place], [place] [address] Phone [phone]. Fax [phone]  Date: 03/12/2020                  Patient Name:  Andrea Haley  MRN: [id number]  DOB: [birth year]  Age / Sex: 86 y.o., female         PCP: Idelle Crouch, MD                 Service Requesting Consult: IM/ Ezekiel Slocumb, DO                 Reason for Consult:  Hyponatremia            History of Present Illness: Patient is a 86 y.o. female with medical problems of Covid infection December 2021, A. fib, coronary artery disease, hypertension, CHF, thyroid disease, who was admitted to [facility] on 03/10/2020 for evaluation of generalized weakness, nausea, vomiting, epigastric abdominal pain.  Poor oral intake with episodes of nausea and vomiting since Covid infection. Nephrology consult requested for hyponatremia.  Presenting sodium of 123 on December 24 which improved to 126 on December 25 Levels then started to decrease and now is down to 122 Creatinine is normal Potassium is normal Urine output recorded at 1900 cc Home medication review-no thiazide diuretic but noted to be on Lasix   Medications: Outpatient medications: Medications Prior to Admission  Medication Sig Dispense Refill Last Dose  . acetaminophen (TYLENOL) 500 MG tablet Take 500-1,000 mg by mouth every 6 (six) hours as needed for mild pain or fever.   Unknown at PRN  . ALPRAZolam (XANAX) 0.25 MG tablet Take 0.25 mg by mouth 3 (three) times daily as needed for anxiety or sleep.   Unknown at PRN  . apixaban (ELIQUIS) 5 MG TABS tablet Take 5 mg by mouth every 12 (twelve) hours.   03/09/2020 at 0800  . carvedilol (COREG) 25 MG tablet Take 25 mg by mouth every 12 (twelve) hours.   03/09/2020 at 0800  . cloNIDine (CATAPRES) 0.1 MG tablet Take 0.1-0.3 mg by mouth See admin instructions. Take 1 tablet (0.1mg ) by mouth every morning, take 2 tablets (0.2mg ) by mouth at lunchtime and take 3 tablets (0.3mg ) by mouth every night    03/09/2020 at 0800  . docusate sodium (COLACE) 100 MG capsule Take 100 mg by mouth daily.     . furosemide (LASIX) 40 MG tablet Take 40 mg by mouth See admin instructions. Take 1 tablet (40mg ) by mouth daily -- take 1 additional tablet (40mg ) daily for excessive fluid gain   14+ days at Unknown  . gabapentin (NEURONTIN) 300 MG capsule Take 300 mg by mouth at bedtime.   36+ hours at Unknown  . hydrALAZINE (APRESOLINE) 50 MG tablet Take 50 mg by mouth 3 (three) times daily.   03/09/2020 at 0800  . lansoprazole (PREVACID) 15 MG capsule Take 15 mg by mouth 2 (two) times daily.   03/09/2020 at 0800  . levothyroxine (SYNTHROID) 75 MCG tablet Take 75 mcg by mouth daily before breakfast. Take qd on empty stomach with a glass of water at least 30-60 minutes before breakfast   03/09/2020 at 0700  . linaclotide (LINZESS) 290 MCG CAPS capsule Take 290 mcg by mouth daily.   03/09/2020 at 0800  . losartan (COZAAR) 100 MG tablet Take 100 mg by mouth at bedtime.   36+ hours at Unknown  . magnesium oxide (MAG-OX) 400 MG tablet Take 400 mg by mouth 2 (two) times daily.  03/09/2020 at 0800  . metoCLOPramide (REGLAN) 5 MG tablet Take 5 mg by mouth 3 (three) times daily before meals.   03/09/2020 at 0800  . ondansetron (ZOFRAN) 4 MG tablet Take 4 mg by mouth every 8 (eight) hours as needed for nausea/vomiting.   Unknown at PRN  . OVER THE COUNTER MEDICATION Take 1 tablet by mouth as directed. Legatrin  Acetaminophen/diphenhydramine (500/50)     . pravastatin (PRAVACHOL) 80 MG tablet Take 80 mg by mouth at bedtime.   36+ hours at Unknown  . prazosin (MINIPRESS) 1 MG capsule Take 2 mg by mouth 2 (two) times daily.   03/09/2020 at 0800  . verapamil (CALAN-SR) 180 MG CR tablet Take 180 mg by mouth daily with lunch.   36+ hours at Unknown    Current medications: Current Facility-Administered Medications  Medication Dose Route Frequency Provider Last Rate Last Admin  . acetaminophen (TYLENOL) tablet 500-1,000 mg   500-1,000 mg Oral Q6H PRN Agbata, Tochukwu, MD      . ALPRAZolam Duanne Moron) tablet 0.25 mg  0.25 mg Oral TID Nicole Kindred A, DO   0.25 mg at 03/12/20 0906  . apixaban (ELIQUIS) tablet 5 mg  5 mg Oral BID Nicole Kindred A, DO   5 mg at 03/12/20 [id number]  . bisacodyl (DULCOLAX) suppository 10 mg  10 mg Rectal Daily PRN Nicole Kindred A, DO      . carvedilol (COREG) tablet 25 mg  25 mg Oral Q12H Agbata, Tochukwu, MD   25 mg at 03/12/20 0905  . cloNIDine (CATAPRES) tablet 0.1 mg  0.1 mg Oral Daily Agbata, Tochukwu, MD   0.1 mg at 03/12/20 0654  . cloNIDine (CATAPRES) tablet 0.2 mg  0.2 mg Oral Daily Agbata, Tochukwu, MD   0.2 mg at 03/12/20 1325  . cloNIDine (CATAPRES) tablet 0.3 mg  0.3 mg Oral QHS Agbata, Tochukwu, MD   0.3 mg at 03/11/20 2100  . gabapentin (NEURONTIN) capsule 300 mg  300 mg Oral QHS Agbata, Tochukwu, MD   300 mg at 03/11/20 2101  . hydrALAZINE (APRESOLINE) injection 10 mg  10 mg Intravenous Q4H PRN Nicole Kindred A, DO   10 mg at 03/12/20 1325  . hydrALAZINE (APRESOLINE) tablet 50 mg  50 mg Oral TID Collier Bullock, MD   50 mg at 03/12/20 0906  . levothyroxine (SYNTHROID) tablet 75 mcg  75 mcg Oral QAC breakfast Agbata, Tochukwu, MD   75 mcg at 03/12/20 0655  . linaclotide (LINZESS) capsule 290 mcg  290 mcg Oral Daily Agbata, Tochukwu, MD   290 mcg at 03/12/20 0905  . LORazepam (ATIVAN) injection 0.5 mg  0.5 mg Intravenous Q6H PRN Nicole Kindred A, DO   0.5 mg at 03/12/20 1156  . losartan (COZAAR) tablet 100 mg  100 mg Oral QHS Agbata, Tochukwu, MD   100 mg at 03/11/20 2101  . magnesium oxide (MAG-OX) tablet 400 mg  400 mg Oral BID Agbata, Tochukwu, MD   400 mg at 03/12/20 0906  . pantoprazole (PROTONIX) injection 40 mg  40 mg Intravenous Q24H Agbata, Tochukwu, MD   40 mg at 03/11/20 1754  . pravastatin (PRAVACHOL) tablet 80 mg  80 mg Oral q1800 Agbata, Tochukwu, MD   80 mg at 03/11/20 1754  . prazosin (MINIPRESS) capsule 2 mg  2 mg Oral BID Agbata, Tochukwu, MD   2 mg at 03/12/20  0905  . senna-docusate (Senokot-S) tablet 1 tablet  1 tablet Oral BID Nicole Kindred A, DO   1 tablet at 03/12/20 [id number]  .  verapamil (CALAN-SR) CR tablet 180 mg  180 mg Oral Q lunch Agbata, Tochukwu, MD   180 mg at 03/12/20 1325      Allergies: Allergies  Allergen Reactions  . Amlodipine   . Codeine   . Cortisone   . Hydrochlorothiazide Other (See Comments)    Confusion  . Lisinopril   . Phenergan [Promethazine]   . Prednisone   . Spironolactone   . Tikosyn [Dofetilide]       Past Medical History: Past Medical History:  Diagnosis Date  . Anemia   . 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 CHF (congestive heart failure) ([place])   . Chronic back pain   . Chronic kidney disease   . Coronary artery disease   . Dysrhythmia    A-fib, ventricular ectopy with palpitations  . Fibrocystic breast disease   . GERD (gastroesophageal reflux disease)   . Heart murmur   . Hyperlipidemia   . Hypertension   . Ischemic colitis ([place])   . Myocardial infarction ([place])   . Obesity   . Peptic ulcer disease   . Renovascular hypertension   . Thyroid disease   . Venous stasis      Past Surgical History: Past Surgical History:  Procedure Laterality Date  . ABDOMINAL HYSTERECTOMY    . CARDIAC ELECTROPHYSIOLOGY MAPPING AND ABLATION    . COLONOSCOPY WITH PROPOFOL N/A 11/26/2018   Procedure: COLONOSCOPY WITH PROPOFO;  Surgeon: Lollie Sails, MD;  Location: [facility] ENDOSCOPY;  Service: Endoscopy;  Laterality: N/A;  . ESOPHAGOGASTRODUODENOSCOPY (EGD) WITH PROPOFOL N/A 04/28/2019   Procedure: ESOPHAGOGASTRODUODENOSCOPY (EGD) WITH PROPOFOL;  Surgeon: Robert Bellow, MD;  Location: [facility] ENDOSCOPY;  Service: Endoscopy;  Laterality: N/A;  . GIVENS CAPSULE STUDY N/A 05/06/2019   Procedure: GIVENS CAPSULE STUDY;  Surgeon: Robert Bellow, MD;  Location: [facility] ENDOSCOPY;  Service: Endoscopy;  Laterality: N/A;  . HAND DEBRIDEMENT Left 11/2015   Debridement skin/SQ tissue, wrist/hand/finger  .  MASTECTOMY Bilateral   . radial artery repair Left 11/2015   aneurysm/pseudoaneurysm repair, graft insertion of radial/ulnar artery     Family History: History reviewed. No pertinent family history.   Social History: Social History   Socioeconomic History  . Marital status: Married    Spouse name: Not on file  . Number of children: Not on file  . Years of education: Not on file  . Highest education level: Not on file  Occupational History  . Not on file  Tobacco Use  . Smoking status: Never Smoker  . Smokeless tobacco: Never Used  Vaping Use  . Vaping Use: Never used  Substance and Sexual Activity  . Alcohol use: Not Currently    Comment: occasional wine qhs  . Drug use: Never  . Sexual activity: Not on file  Other Topics Concern  . Not on file  Social History Narrative  . Not on file   Social Determinants of Health   Financial Resource Strain: Not on file  Food Insecurity: Not on file  Transportation Needs: Not on file  Physical Activity: Not on file  Stress: Not on file  Social Connections: Not on file  Intimate Partner Violence: Not on file   Gen: Denies any fevers or chills HEENT: No vision or hearing problems CV: No chest pain or shortness of breath Resp: No cough or sputum production GI: + nausea, vomiting and diarrhea, prior to admission.  No blood in the stool Decreased appetite GU : No problems with voiding.  No hematuria.  No  previous history of kidney problems MS: ambulates with walker Derm:   No complaints Psych: No complaints Heme: No complaints Neuro: No complaints Endocrine: No complaints   Vital Signs: Blood pressure (!) 172/52, pulse 80, temperature 98.1 F (36.7 C), temperature source Oral, resp. rate 16, height 5\' 5"  (1.651 m), weight 80.6 kg, SpO2 97 %.   Intake/Output Summary (Last 24 hours) at 03/12/2020 1404 Last data filed at 03/12/2020 1359 Gross per 24 hour  Intake 1661.24 ml  Output 1900 ml  Net -238.76 ml    Weight  trends: Filed Weights   03/10/20 0607 03/11/20 0420 03/12/20 0517  Weight: 74.4 kg 79.9 kg 80.6 kg   Physical Exam: General:  No acute distress, laying in the bed  HEENT  anicteric, dry oral mucous membrane  Pulm/lungs  normal breathing effort, lungs are clear to auscultation  CVS/Heart  regular rhythm, no rub or gallop  Abdomen:   Soft, nontender  Extremities:  trace peripheral edema  Neurologic:  Alert, oriented, able to follow commands  Skin:  No acute rashes    Lab results: Basic Metabolic Panel: Recent Labs  Lab 03/10/20 0617 03/10/20 0755 03/11/20 0541 03/12/20 0529 03/12/20 1018  NA 123*  --  126* 124* 122*  K 4.8  --  4.5 4.2  --   CL 89*  --  92* 93*  --   CO2 25  --  25 25  --   GLUCOSE 126*  --  100* 98  --   BUN 18  --  18 12  --   CREATININE 1.11*  --  0.93 0.61  --   CALCIUM 9.3  --  8.7* 8.6*  --   MG  --  2.0  --  2.1  --     Liver Function Tests: Recent Labs  Lab 03/10/20 0617  AST 31  ALT 15  ALKPHOS 72  BILITOT 0.9  PROT 6.7  ALBUMIN 4.1   Recent Labs  Lab 03/10/20 0617  LIPASE 32   No results for input(s): AMMONIA in the last 168 hours.  CBC: Recent Labs  Lab 03/11/20 0541 03/12/20 0529  WBC 8.4 6.4  HGB 10.1* 10.1*  HCT 29.1* 27.9*  MCV 93.0 92.1  PLT 279 243    Cardiac Enzymes: No results for input(s): CKTOTAL, TROPONINI in the last 168 hours.  BNP: Invalid input(s): POCBNP  CBG: No results for input(s): GLUCAP in the last 168 hours.  Microbiology: Recent Results (from the past 720 hour(s))  Culture, blood (Routine X 2) w Reflex to ID Panel     Status: None (Preliminary result)   Collection Time: 03/10/20 10:19 AM   Specimen: BLOOD  Result Value Ref Range Status   Specimen Description BLOOD BLOOD RIGHT WRIST  Final   Special Requests   Final    BOTTLES DRAWN AEROBIC AND ANAEROBIC Blood Culture adequate volume   Culture   Final    NO GROWTH 2 DAYS Performed at [facility], [address]  [place], [place] [address]    Report Status PENDING  Incomplete  Culture, blood (Routine X 2) w Reflex to ID Panel     Status: None (Preliminary result)   Collection Time: 03/10/20 10:19 AM   Specimen: BLOOD  Result Value Ref Range Status   Specimen Description BLOOD RIGHT ANTECUBITAL  Final   Special Requests   Final    BOTTLES DRAWN AEROBIC AND ANAEROBIC Blood Culture adequate volume   Culture   Final  NO GROWTH 2 DAYS Performed at [facility], [address] [place], [place] [address]    Report Status PENDING  Incomplete     Coagulation Studies: Recent Labs    03/10/20 0651  LABPROT 17.9*  INR 1.5*    Urinalysis: Recent Labs    03/10/20 0609  COLORURINE YELLOW*  LABSPEC 1.033*  PHURINE 5.0  GLUCOSEU NEGATIVE  HGBUR NEGATIVE  BILIRUBINUR NEGATIVE  KETONESUR NEGATIVE  PROTEINUR NEGATIVE  NITRITE NEGATIVE  LEUKOCYTESUR NEGATIVE        Imaging:  No results found.   Assessment & Plan: Pt is a 86 y.o.   female with Covid infection December 2021, A. fib, coronary artery disease, hypertension, CHF, thyroid disease,, was admitted on 03/10/2020 with Hyponatremia [E87.1] Hypertensive urgency [I16.0] Non-intractable vomiting with nausea, unspecified vomiting type [R11.2]   Acute on chronic hyponatremia Baseline sodium of 129-133 noted in November 2021 Presenting sodium of 123 this admission Patient was treated with Lactated Ringer, furosemide, half-normal saline, normal saline this admission Urinalysis with specific gravity of 1.033.  Negative for blood and protein Volume status: appears to be dry (with slight vol depletion Patient also underwent CT of the chest abdomen pelvis with IV contrast  Plan: Encourage regular diet Avoiding salt tabs and saline due to significant HTN No known h/o liver disease If Na doesn't correct with Regular diet, can consider tolvaptan Salt tabs + low dose lasix is another option Scheduled Zofran for aggressive control of  Nausea   LOS: 2 Michela Herst 12/26/20212:04 PM    Note: This note was prepared with Dragon dictation. Any transcription errors are unintentional

## 2020-03-12 NOTE — Progress Notes (Signed)
Pt BP 194/73 MAP 107 HR 73 talked to [facility] NP and states to give clonidine 0.1 mg now. Will notify incoming shift. Will continue to monitor.

## 2020-03-12 NOTE — Progress Notes (Addendum)
PROGRESS NOTE    Andrea Haley   XBM:[id number]  DOB: 1931-11-26  PCP: Marguarite Arbour, MD    DOA: 03/10/2020 LOS: 2   Brief Narrative   Andrea Haley is a 86 y.o. female with medical history significant for recent Covid 19 viral infection(Covid PCR test was positive on 02/18/20) patient had predominantly gastrointestinal symptoms which include nausea, vomiting and anorexia, history of A. fib, coronary artery disease, hypertension, CHF and thyroid disease who presents to the ER via EMS for evaluation of multiple symptoms which include generalized weakness, nausea, vomiting and epigastric abdominal pain.  Pt reportedly has had poor p.o. intake with episodes of nausea vomiting since her Covid diagnosis.  Also with constipation and last BM of over a week prior.  Patient has been unable to keep down food or liquids for over a week.  Evaluation in the ED showed hyponatremia with sodium 123, normal troponin x2, mild leukocytosis 13k.  CT of abdomen pelvis was negative for anything acute.  CTA of the chest negative for PE and showed improved patchy airspace opacities consistent with recent Covid pneumonia.    Admitted to hospitalist service for further evaluation and management.  On IV fluids with improving hyponatremia.     Assessment & Plan   Principal Problem:   Hyponatremia Active Problems:   CHF (congestive heart failure) (HCC)   Chronic kidney disease   Thyroid disease   Obesity   Hypertension   GERD (gastroesophageal reflux disease)   Hypertensive urgency   Acute on chronic hyponatremia - most likely due to poor p.o. intake.   Per chart review, appears patient's baseline sodium level is in the low 130s. Per PCP note of 12/21, patient was to be started on Paxil.  This is not on her med history this admission, unclear if it was started Sodium trend: 123 on admission >> 126 >> 124>> 122 --Trend sodium levels every 6 hours --Continue IV fluids, change this morning from  half-normal to normal saline with further reduction in sodium level --Fluids stopped for right now and will have nephrology see --TSH is normal --Cortisol level with tomorrow AM labs --Monitor BMP    Acute gastritis -suspect related to recent COVID-19 infection.   Patient tolerating clears and diet advanced to full liquids.   --Advance diet as tolerated, currently on full liquids.   --Continue IV PPI for now.   --IV Ativan works best for her nausea.     Hypertensive urgency -present on admission and due to patient's inability to keep down her meds so not getting her antihypertensives.  Now tolerating oral meds and home regimen has been resumed.  As needed hydralazine   Constipation -chronic, no BM for over a week at time of admission.  Resumed on home bowel regimen including Linzess.  Also placed on scheduled senna docusate twice a day.  Dulcolax suppository ordered if needed.  Enema if needed.   Hypothyroidism -continue Synthroid.  TSH is normal   Generalized anxiety -chronic, stable.  Continue as needed Xanax.   Paroxysmal A. Fib -rate controlled.  Continue Coreg.  Resume Eliquis tomorrow if hemoglobin stable (held for concern of coffee-ground emesis reported on admission)    Patient BMI: Body mass index is 29.57 kg/m.   DVT prophylaxis: SCDs Start: 03/10/20 1450 apixaban (ELIQUIS) tablet 5 mg   Diet:  Diet Orders (From admission, onward)    Start     Ordered   03/11/20 0911  Diet full liquid Room service appropriate? Yes; Fluid consistency:  Thin  Diet effective now       Question Answer Comment  Room service appropriate? Yes   Fluid consistency: Thin      03/11/20 0910            Code Status: Full Code    Subjective 03/12/20    Patient seen with husband at bedside today.  She unfortunately reports feeling a little worse today than yesterday.  Did eat some grits earlier for breakfast and had recurrence of nausea.  Has not yet had a BM but states she is going to  try today.   Disposition Plan & Communication   Status is: Inpatient  Remains inpatient appropriate because:Inpatient level of care appropriate due to severity of illness.  Remains on IV fluids for hyponatremia, still with inadequate oral intake.   Dispo: The patient is from: Home              Anticipated d/c is to: Home              Anticipated d/c date is: 2 days              Patient currently is not medically stable to d/c.   Family Communication: Daughter was at bedside on rounds today   Consults, Procedures, Significant Events   Consultants:   None  Procedures:   None  Antimicrobials:  Anti-infectives (From admission, onward)   Start     Dose/Rate Route Frequency Ordered Stop   03/10/20 1045  levofloxacin (LEVAQUIN) IVPB 750 mg        750 mg 100 mL/hr over 90 Minutes Intravenous  Once 03/10/20 1042 03/10/20 1351         Objective   Vitals:   03/11/20 2355 03/12/20 0517 03/12/20 0518 03/12/20 0906  BP: (!) 143/48  (!) 194/73 (!) 198/72  Pulse: 69  73 77  Resp: 16  16 16   Temp: (!) 97.5 F (36.4 C)  98.2 F (36.8 C) 98 F (36.7 C)  TempSrc: Oral  Oral Oral  SpO2: 96%  97% 98%  Weight:  80.6 kg    Height:        Intake/Output Summary (Last 24 hours) at 03/12/2020 1305 Last data filed at 03/12/2020 0655 Gross per 24 hour  Intake 1541.24 ml  Output 1900 ml  Net -358.76 ml   Filed Weights   03/10/20 0607 03/11/20 0420 03/12/20 0517  Weight: 74.4 kg 79.9 kg 80.6 kg    Physical Exam:  General exam: awake, alert, no acute distress Respiratory system: CTAB anterior and laterally, no wheezes or rhonchi, normal respiratory effort. Cardiovascular system: normal D2/K0, RRR, 2/6 systolic murmur Gastrointestinal system: soft, NT, mild distention, +bowel sounds. Central nervous system: A&O x4. normal speech, CN's grossly intact  Labs   Data Reviewed: I have personally reviewed following labs and imaging studies  CBC: Recent Labs  Lab  03/10/20 0617 03/11/20 0541 03/12/20 0529  WBC 13.0* 8.4 6.4  HGB 11.0* 10.1* 10.1*  HCT 31.4* 29.1* 27.9*  MCV 91.0 93.0 92.1  PLT 395 279 254   Basic Metabolic Panel: Recent Labs  Lab 03/10/20 0617 03/10/20 0755 03/11/20 0541 03/12/20 0529 03/12/20 1018  NA 123*  --  126* 124* 122*  K 4.8  --  4.5 4.2  --   CL 89*  --  92* 93*  --   CO2 25  --  25 25  --   GLUCOSE 126*  --  100* 98  --   BUN 18  --  18 12  --   CREATININE 1.11*  --  0.93 0.61  --   CALCIUM 9.3  --  8.7* 8.6*  --   MG  --  2.0  --  2.1  --    GFR: Estimated Creatinine Clearance: 52.9 mL/min (by C-G formula based on SCr of 0.61 mg/dL). Liver Function Tests: Recent Labs  Lab 03/10/20 0617  AST 31  ALT 15  ALKPHOS 72  BILITOT 0.9  PROT 6.7  ALBUMIN 4.1   Recent Labs  Lab 03/10/20 0617  LIPASE 32   No results for input(s): AMMONIA in the last 168 hours. Coagulation Profile: Recent Labs  Lab 03/10/20 0651  INR 1.5*   Cardiac Enzymes: No results for input(s): CKTOTAL, CKMB, CKMBINDEX, TROPONINI in the last 168 hours. BNP (last 3 results) No results for input(s): PROBNP in the last 8760 hours. HbA1C: No results for input(s): HGBA1C in the last 72 hours. CBG: No results for input(s): GLUCAP in the last 168 hours. Lipid Profile: No results for input(s): CHOL, HDL, LDLCALC, TRIG, CHOLHDL, LDLDIRECT in the last 72 hours. Thyroid Function Tests: Recent Labs    03/12/20 0529  TSH 0.679   Anemia Panel: No results for input(s): VITAMINB12, FOLATE, FERRITIN, TIBC, IRON, RETICCTPCT in the last 72 hours. Sepsis Labs: No results for input(s): PROCALCITON, LATICACIDVEN in the last 168 hours.  Recent Results (from the past 240 hour(s))  Culture, blood (Routine X 2) w Reflex to ID Panel     Status: None (Preliminary result)   Collection Time: 03/10/20 10:19 AM   Specimen: BLOOD  Result Value Ref Range Status   Specimen Description BLOOD BLOOD RIGHT WRIST  Final   Special Requests   Final     BOTTLES DRAWN AEROBIC AND ANAEROBIC Blood Culture adequate volume   Culture   Final    NO GROWTH 2 DAYS Performed at [facility], [address] [place], [place] [address]    Report Status PENDING  Incomplete  Culture, blood (Routine X 2) w Reflex to ID Panel     Status: None (Preliminary result)   Collection Time: 03/10/20 10:19 AM   Specimen: BLOOD  Result Value Ref Range Status   Specimen Description BLOOD RIGHT ANTECUBITAL  Final   Special Requests   Final    BOTTLES DRAWN AEROBIC AND ANAEROBIC Blood Culture adequate volume   Culture   Final    NO GROWTH 2 DAYS Performed at [facility], [address] [place], [place] [address]    Report Status PENDING  Incomplete      Imaging Studies   No results found.   Medications   Scheduled Meds: . ALPRAZolam  0.25 mg Oral TID  . apixaban  5 mg Oral BID  . carvedilol  25 mg Oral Q12H  . cloNIDine  0.1 mg Oral Daily  . cloNIDine  0.2 mg Oral Daily  . cloNIDine  0.3 mg Oral QHS  . gabapentin  300 mg Oral QHS  . hydrALAZINE  50 mg Oral TID  . levothyroxine  75 mcg Oral QAC breakfast  . linaclotide  290 mcg Oral Daily  . losartan  100 mg Oral QHS  . magnesium oxide  400 mg Oral BID  . pantoprazole (PROTONIX) IV  40 mg Intravenous Q24H  . pravastatin  80 mg Oral q1800  . prazosin  2 mg Oral BID  . senna-docusate  1 tablet Oral BID  . verapamil  180 mg Oral Q lunch   Continuous Infusions:  LOS: 2 days    Time spent: 30 minutes with greater than 50% spent at bedside and in coordination of care    Ezekiel Slocumb, DO [facility]  03/12/2020, 1:05 PM    If 7PM-7AM, please contact night-coverage. How to contact the [facility] Attending or Consulting provider [place] or covering provider during after hours [place], for this patient?    1. Check the care team in [facility] and look for a) attending/consulting [facility] provider listed and b) the [facility] team listed 2. Log into [email or web address] and use [facility]'s universal password to access. If you do not have the password, please contact the hospital operator. 3. Locate the [facility] provider you are looking for under [facility] and page to a number that you can be directly reached. 4. If you still have difficulty reaching the provider, please page the [facility] (Director on Call) for the Hospitalists listed on amion for assistance.

## 2020-03-13 LAB — BPAM RBC
Blood Product Expiration Date: 202112282359
Blood Product Expiration Date: 202112292359
ISSUE DATE / TIME: 202112261301
Unit Type and Rh: 9500
Unit Type and Rh: 9500

## 2020-03-13 LAB — BASIC METABOLIC PANEL
Anion gap: 5 (ref 5–15)
Anion gap: 6 (ref 5–15)
BUN: 13 mg/dL (ref 8–23)
BUN: 13 mg/dL (ref 8–23)
CO2: 27 mmol/L (ref 22–32)
CO2: 25 mmol/L (ref 22–32)
Calcium: 8.1 mg/dL — ABNORMAL LOW (ref 8.9–10.3)
Calcium: 8.1 mg/dL — ABNORMAL LOW (ref 8.9–10.3)
Chloride: 91 mmol/L — ABNORMAL LOW (ref 98–111)
Chloride: 94 mmol/L — ABNORMAL LOW (ref 98–111)
Creatinine, Ser: 0.74 mg/dL (ref 0.44–1.00)
Creatinine, Ser: 0.74 mg/dL (ref 0.44–1.00)
GFR, Estimated: >60 mL/min (ref >60)
GFR, Estimated: >60 mL/min (ref >60)
Glucose, Bld: 109 mg/dL — ABNORMAL HIGH (ref 70–99)
Glucose, Bld: 146 mg/dL — ABNORMAL HIGH (ref 70–99)
Potassium: 4.3 mmol/L (ref 3.5–5.1)
Potassium: 3.8 mmol/L (ref 3.5–5.1)
Sodium: 123 mmol/L — ABNORMAL LOW (ref 135–145)
Sodium: 125 mmol/L — ABNORMAL LOW (ref 135–145)

## 2020-03-13 LAB — HEPATIC FUNCTION PANEL
ALT: 13 U/L (ref 0–44)
AST: 28 U/L (ref 15–41)
Albumin: 2.8 g/dL — ABNORMAL LOW (ref 3.5–5.0)
Alkaline Phosphatase: 55 U/L (ref 38–126)
Bilirubin, Direct: 0.1 mg/dL (ref 0.0–0.2)
Indirect Bilirubin: 0.6 mg/dL (ref 0.3–0.9)
Total Bilirubin: 0.7 mg/dL (ref 0.3–1.2)
Total Protein: 4.8 g/dL — ABNORMAL LOW (ref 6.5–8.1)

## 2020-03-13 LAB — TYPE AND SCREEN
ABO/RH(D): O NEG
Antibody Screen: POSITIVE
Unit division: 0
Unit division: 0

## 2020-03-13 LAB — CORTISOL-AM, BLOOD: Cortisol - AM: 13.4 ug/dL (ref 6.7–22.6)

## 2020-03-13 MED ORDER — PANTOPRAZOLE SODIUM 40 MG PO TBEC
40.0000 mg | DELAYED_RELEASE_TABLET | Freq: Every day | ORAL | Status: DC
  Administered 2020-03-13: 23:00:00 40 mg via ORAL
  Filled 2020-03-13: qty 1

## 2020-03-13 MED ORDER — ACETAMINOPHEN 500 MG PO TABS: 500.0000 mg | ORAL_TABLET | Freq: Four times a day (QID) | ORAL | Status: DC | PRN

## 2020-03-13 MED ORDER — TOLVAPTAN 15 MG PO TABS
15.0000 mg | ORAL_TABLET | Freq: Once | ORAL | Status: AC
  Administered 2020-03-13: 17:00:00 15 mg via ORAL
  Filled 2020-03-13: qty 1

## 2020-03-13 NOTE — Progress Notes (Signed)
[facility]  ROUNDING NOTE   Subjective:  Patient seen and evaluated at bedside. Sodium still low at 123.   Objective:  Vital signs in last 24 hours:  Temp:  [97.4 F (36.3 C)-98.4 F (36.9 C)] 97.8 F (36.6 C) (12/27 1141) Pulse Rate:  [69-74] 72 (12/27 1141) Resp:  [17-18] 17 (12/27 0427) BP: (149-173)/(48-64) 161/55 (12/27 1141) SpO2:  [95 %-98 %] 96 % (12/27 1141) Weight:  [82.1 kg] 82.1 kg (12/27 0427)  Weight change: 1.497 kg Filed Weights   03/11/20 0420 03/12/20 0517 03/13/20 0427  Weight: 79.9 kg 80.6 kg 82.1 kg    Intake/Output: I/O last 3 completed shifts: In: 2421.2 [P.O.:1600; I.V.:821.2] Out: 1200 [Urine:1200]   Intake/Output this shift:  Total I/O In: 480 [P.O.:480] Out: 750 [Urine:750]  Physical Exam: General:  No acute distress  Head:  Normocephalic, atraumatic. Moist oral mucosal membranes  Eyes:  Anicteric  Neck:  Supple  Lungs:   Clear to auscultation, normal effort  Heart:  S1S2 no rubs  Abdomen:   Soft, nontender, bowel sounds present  Extremities:  No peripheral edema.  Neurologic:  Awake, alert, following commands  Skin:  No lesions  Access:  No dialysis access present.    Basic Metabolic Panel: Recent Labs  Lab 03/10/20 0617 03/10/20 0755 03/11/20 0541 03/12/20 0529 03/12/20 1018 03/12/20 1619 03/12/20 2232 03/13/20 0513  NA 123*  --  126* 124* 122* 121* 122* 123*  K 4.8  --  4.5 4.2  --   --   --  4.3  CL 89*  --  92* 93*  --   --   --  91*  CO2 25  --  25 25  --   --   --  27  GLUCOSE 126*  --  100* 98  --   --   --  109*  BUN 18  --  18 12  --   --   --  13  CREATININE 1.11*  --  0.93 0.61  --   --   --  0.74  CALCIUM 9.3  --  8.7* 8.6*  --   --   --  8.1*  MG  --  2.0  --  2.1  --   --   --   --     Liver Function Tests: Recent Labs  Lab 03/10/20 0617  AST 31  ALT 15  ALKPHOS 72  BILITOT 0.9  PROT 6.7  ALBUMIN 4.1   Recent Labs  Lab 03/10/20 0617  LIPASE 32   No results for input(s):  AMMONIA in the last 168 hours.  CBC: Recent Labs  Lab 03/10/20 0617 03/11/20 0541 03/12/20 0529  WBC 13.0* 8.4 6.4  HGB 11.0* 10.1* 10.1*  HCT 31.4* 29.1* 27.9*  MCV 91.0 93.0 92.1  PLT 395 279 243    Cardiac Enzymes: No results for input(s): CKTOTAL, CKMB, CKMBINDEX, TROPONINI in the last 168 hours.  BNP: Invalid input(s): POCBNP  CBG: No results for input(s): GLUCAP in the last 168 hours.  Microbiology: Results for orders placed or performed during the hospital encounter of 03/10/20  Culture, blood (Routine X 2) w Reflex to ID Panel     Status: None (Preliminary result)   Collection Time: 03/10/20 10:19 AM   Specimen: BLOOD  Result Value Ref Range Status   Specimen Description BLOOD BLOOD RIGHT WRIST  Final   Special Requests   Final    BOTTLES DRAWN AEROBIC AND ANAEROBIC Blood Culture adequate volume  Culture   Final    NO GROWTH 3 DAYS Performed at [facility], [address] [place], [place] [address]    Report Status PENDING  Incomplete  Culture, blood (Routine X 2) w Reflex to ID Panel     Status: None (Preliminary result)   Collection Time: 03/10/20 10:19 AM   Specimen: BLOOD  Result Value Ref Range Status   Specimen Description BLOOD RIGHT ANTECUBITAL  Final   Special Requests   Final    BOTTLES DRAWN AEROBIC AND ANAEROBIC Blood Culture adequate volume   Culture   Final    NO GROWTH 3 DAYS Performed at [facility], [address] [place], [place] [address]    Report Status PENDING  Incomplete    Coagulation Studies: No results for input(s): LABPROT, INR in the last 72 hours.  Urinalysis: No results for input(s): COLORURINE, LABSPEC, PHURINE, GLUCOSEU, HGBUR, BILIRUBINUR, KETONESUR, PROTEINUR, UROBILINOGEN, NITRITE, LEUKOCYTESUR in the last 72 hours.  Invalid input(s): APPERANCEUR    Imaging: No results found.   Medications:    . ALPRAZolam  0.25 mg Oral TID  . apixaban  5 mg Oral BID  . carvedilol  25 mg Oral Q12H   . cloNIDine  0.1 mg Oral Daily  . cloNIDine  0.2 mg Oral Daily  . cloNIDine  0.3 mg Oral QHS  . feeding supplement  237 mL Oral BID BM  . gabapentin  300 mg Oral QHS  . hydrALAZINE  50 mg Oral TID  . levothyroxine  75 mcg Oral QAC breakfast  . linaclotide  290 mcg Oral Daily  . losartan  100 mg Oral QHS  . magnesium oxide  400 mg Oral BID  . ondansetron  4 mg Oral TID  . pantoprazole  40 mg Oral Daily  . pravastatin  80 mg Oral q1800  . prazosin  2 mg Oral BID  . senna-docusate  1 tablet Oral BID  . verapamil  180 mg Oral Q lunch   acetaminophen, bisacodyl, hydrALAZINE, LORazepam  Assessment/ Plan:  86 y.o. female with past medical history of COVID-19 infection in December 2021, atrial fibrillation, coronary disease, hypertension, congestive heart failure, hyperlipidemia, hypothyroidism who was admitted for hypertensive urgency, nausea/vomiting, and found to have hyponatremia.  1.  Acute on chronic hyponatremia.  Serum sodium was found be 1 29-1 33 in November 2021.  Serum sodium still low at 123.  She was provided with initial volume repletion as she was having nausea and vomiting.  Despite this serum sodium remains low suggesting underlying SIADH.  Administer tolvaptan 15 mg x 1 today and determine response.  Goal serum sodium is 131 by tomorrow.  Counseled the patient on fluid restriction of 40 ounces of water or less per day.  2.  Blood pressure currently 161/55.  Continue carvedilol, clonidine, hydralazine, losartan, and prazosin at the current doses.   LOS: 3 Brookelin Felber 12/27/20212:26 PM

## 2020-03-13 NOTE — Evaluation (Signed)
Occupational Therapy Evaluation Patient Details Name: Andrea Haley MRN: [id number] DOB: Nov 12, 1931 Today's Date: 03/13/2020    History of Present Illness Andrea Haley is a 86 y.o. female with a PMH significant for recent COVID-19 12/3 dx, chronic hyponatremia, Afib, CAD, HTN, CHF, thyroid disease, GERD, obesity, CKD. presented from home to the ED on 03/10/2020 with GI distress x several days and found to have hyponatremia.   Clinical Impression   Andrea Haley was seen for OT evaluation this date. Prior to hospital admission, pt was MO DI for mobility and ADLs using SPC as needed. Pt lives c husband who is available 24/7 and daughter visits daily. Pt lives in a home c 4 STE and B rails, # steps down to bedroom c B rails. Pt presents to acute OT demonstrating impaired ADL performance and functional mobility 2/2 decreased LB access and functional strength/balance/endurance deficits.   Pt currently requires MIN A for bed mobility - assist for trunk control. MIN A + RW for ADL t/f, assist for lift off. CGA adjust B socks seated EOB - anticiipate MIN A for threading over toes. CGA + RW for dynamic standing balalnce - requires single UE support on RW. Pt tolerated ~ 30 ft mobility. Pt and family receptive to education and return demonstrated bed level exercises. Pt would benefit from skilled OT to address noted impairments and functional limitations (see below for any additional details) in order to maximize safety and independence while minimizing falls risk and caregiver burden. Upon hospital discharge, recommend HHOT to maximize pt safety and return to functional independence during meaningful occupations of daily life.  ORTHOSTATICS: SUPINE: BP 152/37, MAP 71 SITTING: BP 134/64, MAP 79 - endorses mild dizziness, resolved quickly STANDING: BP 155/56, MAP 77     Follow Up Recommendations  Home health OT;Other (comment) (Supervision for mobility)    Equipment Recommendations  Other (comment)  Adult nurse)    Recommendations for Other Services       Precautions / Restrictions Precautions Precautions: Fall Restrictions Weight Bearing Restrictions: No      Mobility Bed Mobility Overal bed mobility: Needs Assistance Bed Mobility: Supine to Sit;Sit to Supine     Supine to sit: Min assist Sit to supine: Min assist   General bed mobility comments: assist for sequencing and trunk control    Transfers Overall transfer level: Needs assistance Equipment used: Rolling walker (2 wheeled) Transfers: Sit to/from Stand Sit to Stand: Min assist         General transfer comment: assist for sequencing and lift off    Balance Overall balance assessment: Needs assistance Sitting-balance support: No upper extremity supported;Feet supported Sitting balance-Leahy Scale: Good     Standing balance support: Bilateral upper extremity supported Standing balance-Leahy Scale: Fair                             ADL either performed or assessed with clinical judgement   ADL Overall ADL's : Needs assistance/impaired                                       General ADL Comments: MIN A + RW for ADL t/f. CGA adjust B socks seated EOB - anticiipate MIN A for threading over toes. CGA + RW for dynamic standing balalnce - requires single UE support on RW  Pertinent Vitals/Pain Pain Assessment: Faces Faces Pain Scale: Hurts a little bit Pain Location: R forearm Pain Descriptors / Indicators: Dull;Grimacing Pain Intervention(s): Limited activity within patient's tolerance;Repositioned     Hand Dominance Right   Extremity/Trunk Assessment Upper Extremity Assessment Upper Extremity Assessment: Generalized weakness   Lower Extremity Assessment Lower Extremity Assessment: Generalized weakness       Communication Communication Communication: No difficulties   Cognition Arousal/Alertness: Awake/alert Behavior During Therapy: Flat  affect Overall Cognitive Status: Within Functional Limits for tasks assessed                                 General Comments: Decreased processing and requires VCs for sequencing   General Comments  SUPINE: BP 152/37, MAP 71. SITTING: BP 134/64, MAP 79. STANDING: BP 155/56, MAP 77    Exercises Exercises: Other exercises;General Lower Extremity General Exercises - Lower Extremity Ankle Circles/Pumps: AROM;Strengthening;Both;5 reps;Supine Gluteal Sets: AROM;Strengthening;Both;5 reps;Supine Short Arc Quad: AROM;Strengthening;Both;5 reps;Supine Heel Slides: AROM;Strengthening;Both;5 reps;Supine Other Exercises Other Exercises: Pt and family educated re: OT role, DME recs, d/c recs, falls prevention, ECS, safe RW technique, home/routines modifications Other Exercises: LBD, sup<>sit, sit<>stand, sitting/standing balance/tolerance, ~30 ft mobility   Shoulder Instructions      Home Living Family/patient expects to be discharged to:: Private residence Living Arrangements: Spouse/significant other Available Help at Discharge: Family;Available 24 hours/day Type of Home: House Home Access: Stairs to enter [employer] of Steps: 4 Entrance Stairs-Rails: Can reach both Home Layout: Other (Comment) (split level, 3 steps down to bedroom c B rails, 3 steps down to den c single rail)     Bathroom Shower/Tub: Aeronautical engineer: Kasandra Knudsen - single point;Bedside commode;Grab bars - tub/shower;Grab bars - toilet          Prior Functioning/Environment Level of Independence: Independent with assistive device(s)        Comments: MOD I for mobility adn ADLs using SPC outside of home        OT Problem List: Decreased strength;Decreased activity tolerance;Decreased range of motion;Impaired balance (sitting and/or standing);Decreased safety awareness      OT Treatment/Interventions: Self-care/ADL training;Therapeutic exercise;Energy conservation;DME  and/or AE instruction;Therapeutic activities;Patient/family education;Balance training    OT Goals(Current goals can be found in the care plan section) Acute Rehab OT Goals Patient Stated Goal: To return home OT Goal Formulation: With patient/family Time For Goal Achievement: 03/27/20 Potential to Achieve Goals: Good ADL Goals Pt Will Perform Grooming: with modified independence;standing (c LRAD PRN) Pt Will Perform Lower Body Dressing: with modified independence;sit to/from stand (c LRAD PRN) Pt Will Transfer to Toilet: with modified independence;ambulating;regular height toilet (c LRAD PRN)  OT Frequency: Min 1X/week    AM-PAC OT "6 Clicks" Daily Activity     Outcome Measure Help from another person eating meals?: None Help from another person taking care of personal grooming?: A Little Help from another person toileting, which includes using toliet, bedpan, or urinal?: A Little Help from another person bathing (including washing, rinsing, drying)?: A Little Help from another person to put on and taking off regular upper body clothing?: None Help from another person to put on and taking off regular lower body clothing?: A Little 6 Click Score: 20   End of Session Equipment Utilized During Treatment: Rolling walker  Activity Tolerance: Patient tolerated treatment well Patient left: in bed;with call bell/phone within reach;with bed alarm set;with family/visitor present  OT  Visit Diagnosis: Other abnormalities of gait and mobility (R26.89)                Time: [id number] OT Time Calculation (min): 24 min Charges:  OT General Charges $OT Visit: 1 Visit OT Evaluation $OT Eval Low Complexity: 1 Low OT Treatments $Self Care/Home Management : 23-37 mins  Dessie Coma, M.S. OTR/L  03/13/20, 3:19 PM  ascom [phone]

## 2020-03-13 NOTE — Progress Notes (Signed)
Orders to transfer to Med- Surg rm 227/ report called to [facility]/ verbalized an understanding/ transferred in bed.

## 2020-03-13 NOTE — Care Management Important Message (Signed)
Important Message  Patient Details  Name: Andrea Haley MRN: [id number] Date of Birth: [birth year]   Medicare Important Message Given:  Yes     Johnell Comings 03/13/2020, 12:22 PM

## 2020-03-13 NOTE — Progress Notes (Signed)
Mobility Specialist - Progress Note   03/13/20 1400  Mobility  Activity Refused mobility  Mobility performed by Mobility specialist    Pt was lying in bed upon arrival with family present in room. Pt politely declined mobility at this time d/t fatigue from just completing ambulatory activity with OT prior to entry. Will hold off and attempt session at another date/time as appropriate.    Filiberto Pinks Mobility Specialist 03/13/20, 2:45 PM

## 2020-03-13 NOTE — Progress Notes (Addendum)
PROGRESS NOTE    Andrea Haley   GLO:[id number]  DOB: March 28, 1931  PCP: Marguarite Arbour, MD   Code Status: Full Code    DOA: 03/10/2020   LOS: 3   ________________________________________  Attending MD Note:  I have seen and examined the patient with resident and agree with the note below which has been edited to reflect our agreed upon history, exam, and assessment/plan.    I have personally reviewed the orders for the patient, which were made under my direction.     Pennie Banter, DO [facility]  ________________________________________  Brief Narrative of Current Hospitalization   Andrea Haley is a 86 y.o. female with a PMH significant for recent COVID-19 12/3 dx, chronic hyponatremia, Afib, CAD, HTN, CHF, thyroid disease, GERD, obesity, CKD IIIa. They presented from home to the ED on 03/10/2020 with GI distress x several days. In the ED, it was found that they had hyponatremia, normal abdominal CT. They were treated with IV fluids.  Patient was admitted to medicine service for further workup and management of hyponatremia and poor PO tolerability as outlined in detail below.  03/13/20 -worsened Na   Assessment & Plan   Principal Problem:   Hyponatremia Active Problems:   CHF (congestive heart failure) (HCC)   Chronic kidney disease   Thyroid disease   Obesity   Hypertension   GERD (gastroesophageal reflux disease)   Hypertensive urgency  Acute on chronic hyponatremia- worsened by poor PO intake and GI loss in setting of COVID-19 infection (dx 12/3). Patient possibly on paxil op. She had not had good PO intake so without IV replenishment showed continued low Na (122>121>122>123). 1220cc UOP yesterday. Patient is fluid restricting and this morning able to tolerate a sausage biscuit and hasbrowns from [employer]. She also had two normal BMs this morning. Given return of more normal I/O function, would expect her Na trend to follow.  Am cortisol levels  wnl. - nephrology consulted, appreciate recs and care  - regular diet and monitor.   - if no improvement, consider tolvaptan or salt tabs + lasix. - Na+ level q6 hours - encourage good PO and fluid restriction - continue home linzess  HTN- chronic, poorly controlled. BP ranged from 149-198 SBP, 48-72 DBP. With having such a large pulse pressure and age, have to be careful with treatment. Has a history of hypertensive urgency. Home meds include carvedilol 25mg  BID, clonidine 0.1mg  am, 0.2mg  afternoon, 0.3mg  nightly, lasix 40mg , losartan 100mg , prazosin 1mg , verapamil 180mg  daily. Am cortisol levels wnl. - hydralazine scheduled and PRN SBP >180 - carvedilol 25mg  BID - clonidine per home dosing above - losartan 100mg  daily - prazosin 2mg  bid - verapamil 180mg  daily - monitor BP q shift and if symptomatic  Hypothyroidism- chronic, stable. TSH 0.679 this admission.  - continue home levothyroxine  H/o CKD IIIa- currently with normal Cr and GFR  - monitor BMP  - avoid nephrotoxic agents  Afib- chronic. Cardiology at [facility]. on eliquis 5mg  BID. Rate controlled. - continue rate control and anticoag as above  Patient BMI: Body mass index is 30.12 kg/m.   DVT prophylaxis: SCDs Start: 03/10/20 1450 apixaban (ELIQUIS) tablet 5 mg   Diet:  Diet Orders (From admission, onward)     Start     Ordered   03/12/20 1444  Diet regular Room service appropriate? Yes; Fluid consistency: Thin  Diet effective now       Question Answer Comment  Room service appropriate? Yes  Fluid consistency: Thin      03/12/20 1443            Subjective 03/13/20    Pt reports feeling well today. She had a sausage biscuit and hashbrowns for breakfast and is now working on an ensure shake. She has experienced no N/V yet today but has not been very active yet which is her normal trigger for those symptoms. Had 2 normal BMs this am which is first for about a week. She and her daughter at bedside are  agreeable to the plan to continue with improving sodium prior to discharge.  Disposition Plan & Communication   Status is: Inpatient  Remains inpatient appropriate because:Persistent severe electrolyte disturbances   Dispo:  Patient From: Home  Planned Disposition: Home  Expected discharge date: 03/13/2020  Medically stable for discharge: No    Family Communication: daughter at bedside updated. Answered all questions   Consults, Procedures, Significant Events   Consultants:  nephrology  Procedures/significant events:  none  Antimicrobials:  Anti-infectives (From admission, onward)    Start     Dose/Rate Route Frequency Ordered Stop   03/10/20 1045  levofloxacin (LEVAQUIN) IVPB 750 mg        750 mg 100 mL/hr over 90 Minutes Intravenous  Once 03/10/20 1042 03/10/20 1351        Objective   Vitals:   03/12/20 1318 03/12/20 1755 03/12/20 1957 03/13/20 0427  BP: (!) 172/52 (!) 173/64 (!) 149/48 (!) 151/49  Pulse: 80 74 73 69  Resp: 16 18 17 17   Temp: 98.1 F (36.7 C) 98.4 F (36.9 C) 97.7 F (36.5 C) 97.9 F (36.6 C)  TempSrc: Oral Oral Oral Oral  SpO2: 97% 98% 96% 97%  Weight:    82.1 kg  Height:        Intake/Output Summary (Last 24 hours) at 03/13/2020 0740 Last data filed at 03/13/2020 0500 Gross per 24 hour  Intake 1120 ml  Output --  Net 1120 ml   Filed Weights   03/11/20 0420 03/12/20 0517 03/13/20 0427  Weight: 79.9 kg 80.6 kg 82.1 kg    Physical Exam:  General: awake, alert, NAD HEENT: atraumatic, clear conjunctiva, anicteric sclera, moist mucus membranes, hard of hearing negative cervical lymphadenopathy or thyromegaly  Respiratory system: CTAB, no wheezes, rales or rhonchi, normal respiratory effort. Cardiovascular system: normal S1/S2, RRR Gastrointestinal system: soft, NT, ND, no HSM felt, +bowel sounds. Central nervous system: A&O x4. no gross focal neurologic deficits, normal speech Extremities: moves all equally, trace edema, normal  tone Skin: dry, intact, normal temperature, normal color, No rashes, lesions or ulcers Psychiatry: normal mood, congruent affect, judgement and insight appear normal  Labs   Data Reviewed: I have personally reviewed following labs and imaging studies  CBC: Recent Labs  Lab 03/10/20 0617 03/11/20 0541 03/12/20 0529  WBC 13.0* 8.4 6.4  HGB 11.0* 10.1* 10.1*  HCT 31.4* 29.1* 27.9*  MCV 91.0 93.0 92.1  PLT 395 279 086   Basic Metabolic Panel: Recent Labs  Lab 03/10/20 0617 03/10/20 0755 03/11/20 0541 03/12/20 0529 03/12/20 1018 03/12/20 1619 03/12/20 2232 03/13/20 0513  NA 123*  --  126* 124* 122* 121* 122* 123*  K 4.8  --  4.5 4.2  --   --   --  4.3  CL 89*  --  92* 93*  --   --   --  91*  CO2 25  --  25 25  --   --   --  27  GLUCOSE 126*  --  100* 98  --   --   --  109*  BUN 18  --  18 12  --   --   --  13  CREATININE 1.11*  --  0.93 0.61  --   --   --  0.74  CALCIUM 9.3  --  8.7* 8.6*  --   --   --  8.1*  MG  --  2.0  --  2.1  --   --   --   --    GFR: Estimated Creatinine Clearance: 53.4 mL/min (by C-G formula based on SCr of 0.74 mg/dL). Liver Function Tests: Recent Labs  Lab 03/10/20 0617  AST 31  ALT 15  ALKPHOS 72  BILITOT 0.9  PROT 6.7  ALBUMIN 4.1   Recent Labs  Lab 03/10/20 0617  LIPASE 32   No results for input(s): AMMONIA in the last 168 hours. Coagulation Profile: Recent Labs  Lab 03/10/20 0651  INR 1.5*   Cardiac Enzymes: No results for input(s): CKTOTAL, CKMB, CKMBINDEX, TROPONINI in the last 168 hours. BNP (last 3 results) No results for input(s): PROBNP in the last 8760 hours. HbA1C: No results for input(s): HGBA1C in the last 72 hours. CBG: No results for input(s): GLUCAP in the last 168 hours. Lipid Profile: No results for input(s): CHOL, HDL, LDLCALC, TRIG, CHOLHDL, LDLDIRECT in the last 72 hours. Thyroid Function Tests: Recent Labs    03/12/20 0529  TSH 0.679   Anemia Panel: No results for input(s): VITAMINB12,  FOLATE, FERRITIN, TIBC, IRON, RETICCTPCT in the last 72 hours. Sepsis Labs: No results for input(s): PROCALCITON, LATICACIDVEN in the last 168 hours.  Recent Results (from the past 240 hour(s))  Culture, blood (Routine X 2) w Reflex to ID Panel     Status: None (Preliminary result)   Collection Time: 03/10/20 10:19 AM   Specimen: BLOOD  Result Value Ref Range Status   Specimen Description BLOOD BLOOD RIGHT WRIST  Final   Special Requests   Final    BOTTLES DRAWN AEROBIC AND ANAEROBIC Blood Culture adequate volume   Culture   Final    NO GROWTH 3 DAYS Performed at [facility], [address], [place], [place] [address]    Report Status PENDING  Incomplete  Culture, blood (Routine X 2) w Reflex to ID Panel     Status: None (Preliminary result)   Collection Time: 03/10/20 10:19 AM   Specimen: BLOOD  Result Value Ref Range Status   Specimen Description BLOOD RIGHT ANTECUBITAL  Final   Special Requests   Final    BOTTLES DRAWN AEROBIC AND ANAEROBIC Blood Culture adequate volume   Culture   Final    NO GROWTH 3 DAYS Performed at [facility], [address] [place], [place] [address]    Report Status PENDING  Incomplete     Imaging Studies   No results found.  Medications   Scheduled Meds:  ALPRAZolam  0.25 mg Oral TID   apixaban  5 mg Oral BID   carvedilol  25 mg Oral Q12H   cloNIDine  0.1 mg Oral Daily   cloNIDine  0.2 mg Oral Daily   cloNIDine  0.3 mg Oral QHS   feeding supplement  237 mL Oral BID BM   gabapentin  300 mg Oral QHS   hydrALAZINE  50 mg Oral TID   levothyroxine  75 mcg Oral QAC breakfast   linaclotide  290 mcg Oral Daily   losartan  100 mg Oral QHS   magnesium oxide  400 mg Oral BID   ondansetron  4 mg Oral TID   pantoprazole (PROTONIX) IV  40 mg Intravenous Q24H   pravastatin  80 mg Oral q1800   prazosin  2 mg Oral BID   senna-docusate  1 tablet Oral BID   verapamil  180 mg Oral Q lunch   Continuous Infusions:    LOS: 3  days    Time spent: 25 minutes   Leeroy Bock, DO PGY-3 FM resident    [facility] 03/13/2020, 7:40 AM    If 7PM-7AM, please contact night-coverage. How to contact the [facility] Attending or Consulting provider 7A - 7P or covering provider during after hours 7P -7A, for this patient?    Check the care team in [facility] and look for a) attending/consulting TRH provider listed and b) the [facility] team listed Log into [email or web address] and use [place]'s universal password to access. If you do not have the password, please contact the hospital operator. Locate the [facility] provider you are looking for under [facility] and page to a number that you can be directly reached. If you still have difficulty reaching the provider, please page the [facility] (Director on Call) for the Hospitalists listed on amion for assistance.

## 2020-03-14 DIAGNOSIS — I160 Hypertensive urgency: Secondary | ICD-10-CM | POA: Diagnosis not present

## 2020-03-14 DIAGNOSIS — E871 Hypo-osmolality and hyponatremia: Secondary | ICD-10-CM | POA: Diagnosis not present

## 2020-03-14 LAB — SODIUM: Sodium: 132 mmol/L — ABNORMAL LOW (ref 135–145)

## 2020-03-14 MED ORDER — TOLVAPTAN 15 MG PO TABS
15.0000 mg | ORAL_TABLET | Freq: Once | ORAL | Status: DC
  Filled 2020-03-14: qty 1

## 2020-03-14 MED ORDER — ENSURE ENLIVE PO LIQD: 237.0000 mL | Freq: Two times a day (BID) | ORAL | 12 refills | Status: AC

## 2020-03-14 NOTE — Evaluation (Signed)
Physical Therapy Evaluation Patient Details Name: Andrea Haley MRN: [id number] DOB: 12/13/1931 Today's Date: 03/14/2020   History of Present Illness  Andrea Haley is a 86 y.o. female with a PMH significant for recent COVID-19 12/3 dx, chronic hyponatremia, Afib, CAD, HTN, CHF, thyroid disease, GERD, obesity, CKD. presented from home to the ED on 03/10/2020 with GI distress x several days and found to have hyponatremia.  Clinical Impression  The pt presents today is great spirits. She reports no pain throughout the session. Andrea Haley does require increased time for procession, however, when given time is able to answer all questions and complete all tasks appropriately. The pt is able to tolerate ambulation of ~200' as well as stair simulation. The pt demonstrates Mod I with bed mobility and supervision of gait. At this time she would greatly benefit from HHPT, HHOT, and 24/7 supervision for all mobility. PT will continue to follow the patient while in-house in order to optimize towards PLOF.     Follow Up Recommendations Home health PT    Equipment Recommendations  Rolling walker with 5" wheels    Recommendations for Other Services       Precautions / Restrictions Precautions Precautions: Fall      Mobility  Bed Mobility Overal bed mobility: Modified Independent (with use of bed railings) Bed Mobility: Supine to Sit;Sit to Supine     Supine to sit: Modified independent (Device/Increase time) (HOB flat, use of bed railings) Sit to supine: Min assist (for LE management)   General bed mobility comments: Pt encouraged to simulate home environment for bed mobility and agreeable to try.    Transfers Overall transfer level: Modified independent Equipment used: Rolling walker (2 wheeled) Transfers: Sit to/from Stand Sit to Stand: Modified independent (Device/Increase time)         General transfer comment: Initially requiring Max cues for hand placement, but demonstrates carry over  with sit<>stand testing.  Ambulation/Gait Ambulation/Gait assistance: Supervision Gait Distance (Feet): 215 Feet Assistive device: Rolling walker (2 wheeled) Gait Pattern/deviations: WFL(Within Functional Limits) Gait velocity: 0.42 Gait velocity interpretation: <1.31 ft/sec, indicative of household ambulator General Gait Details: Pt requiring x3 standing rest breaks for distance. She presents with increased WB through UE during gait.  Stairs Stairs: Yes       General stair comments: Simulated steps with unilateral handrail and HHA. Pt does not present with any overt LOB.  Wheelchair Mobility    Modified Rankin (Stroke Patients Only)       Balance Overall balance assessment: No apparent balance deficits (not formally assessed)         Standing balance support: Bilateral upper extremity supported Standing balance-Leahy Scale: Good                               Pertinent Vitals/Pain Pain Assessment: No/denies pain    Home Living Family/patient expects to be discharged to:: Private residence Living Arrangements: Spouse/significant other Available Help at Discharge: Family;Available 24 hours/day Type of Home: House Home Access: Stairs to enter Entrance Stairs-Rails: Can reach both Entrance Stairs-Number of Steps: 3 Home Layout: Other (Comment);Bed/bath upstairs (split level home.) Home Equipment: Cane - single point;Bedside commode;Grab bars - tub/shower;Grab bars - toilet      Prior Function Level of Independence: Independent (intermittent cane use for community ambulation.)         Comments: MOD I for mobility and ADLs using SPC outside of home     Hand Dominance  Dominant Hand: Right    Extremity/Trunk Assessment   Upper Extremity Assessment Upper Extremity Assessment: Overall WFL for tasks assessed    Lower Extremity Assessment Lower Extremity Assessment: Overall WFL for tasks assessed    Cervical / Trunk Assessment Cervical /  Trunk Assessment: Normal  Communication   Communication: No difficulties  Cognition Arousal/Alertness: Awake/alert Behavior During Therapy: WFL for tasks assessed/performed Overall Cognitive Status: Within Functional Limits for tasks assessed                                 General Comments: Requires increased time for processing.      General Comments      Exercises     Assessment/Plan    PT Assessment Patient needs continued PT services  PT Problem List Decreased strength;Decreased mobility;Decreased activity tolerance;Decreased balance       PT Treatment Interventions DME instruction;Gait training;Therapeutic activities;Therapeutic exercise;Stair training;Balance training;Functional mobility training    PT Goals (Current goals can be found in the Care Plan section)  Acute Rehab PT Goals Patient Stated Goal: To return home PT Goal Formulation: With patient Time For Goal Achievement: 03/28/20 Potential to Achieve Goals: Good    Frequency Min 2X/week   Barriers to discharge        Co-evaluation               AM-PAC PT "6 Clicks" Mobility  Outcome Measure Help needed turning from your back to your side while in a flat bed without using bedrails?: A Little Help needed moving from lying on your back to sitting on the side of a flat bed without using bedrails?: A Little Help needed moving to and from a bed to a chair (including a wheelchair)?: A Little Help needed standing up from a chair using your arms (e.g., wheelchair or bedside chair)?: A Little Help needed to walk in hospital room?: A Little Help needed climbing 3-5 steps with a railing? : A Little 6 Click Score: 18    End of Session Equipment Utilized During Treatment: Gait belt Activity Tolerance: Patient tolerated treatment well;No increased pain Patient left: in bed;with bed alarm set;with family/visitor present Nurse Communication: Mobility status;Other (comment) PT Visit Diagnosis:  Unsteadiness on feet (R26.81)    Time: 1330-1409 PT Time Calculation (min) (ACUTE ONLY): 39 min   Charges:   PT Evaluation $PT Eval Low Complexity: 1 Low PT Treatments $Gait Training: 23-37 mins        2:27 PM, 03/14/20 Andrea Haley PT, DPT Physical Therapist - [facility]   Andrea Haley 03/14/2020, 2:24 PM

## 2020-03-14 NOTE — TOC Initial Note (Addendum)
Transition of Care [facility]) - Initial/Assessment Note    Patient Details  Name: Andrea Haley MRN: [id number] Date of Birth: [birth year]  Transition of Care [facility]) CM/SW Contact:    Maree Krabbe, LCSW Phone Number: 03/14/2020, 11:47 AM  Clinical Narrative:  Pt's daughter present at bedside. Pt lives with spouse however daughter very much involved in her care. Agreeable to [facility]-- daughter states [facility] used [facility] before but did not have a preference. Pt will also need a walker. Referral sent to [facility] however there start of care is delayed, CSW tried [facility] and they can accept. Elease Hashimoto notified of walker order.                Expected Discharge Plan: Home w Home Health Services Barriers to Discharge: Continued Medical Work up   Patient Goals and CMS Choice Patient states their goals for this hospitalization and ongoing recovery are:: to get pt better   Choice offered to / list presented to : Patient,Adult Children  Expected Discharge Plan and Services Expected Discharge Plan: Home w Home Health Services In-house Referral: Clinical Social Work   Post Acute Care Choice: Home Health Living arrangements for the past 2 months: Single Family Home                 DME Arranged: Dan Humphreys DME Agency: [employer] Date DME Agency Contacted: 03/14/20 Time DME Agency Contacted: 1146 Representative spoke with at DME Agency: patricia HH Arranged: PT,OT HH Agency: [facility] Date HH Agency Contacted: 03/14/20 Time HH Agency Contacted: 1146 Representative spoke with at [facility] Agency: teresa  Prior Living Arrangements/Services Living arrangements for the past 2 months: Single Family Home Lives with:: Spouse Patient language and need for interpreter reviewed:: Yes Do you feel safe going back to the place where you live?: Yes      Need for Family Participation in Patient Care: Yes (Comment) Care giver support system in place?: Yes (comment)   Criminal  Activity/Legal Involvement Pertinent to Current Situation/Hospitalization: No - Comment as needed  Activities of Daily Living Home Assistive Devices/Equipment: Cane (specify quad or straight) (1 cane prong) ADL Screening (condition at time of admission) Patient's cognitive ability adequate to safely complete daily activities?: Yes Is the patient deaf or have difficulty hearing?: Yes Does the patient have difficulty seeing, even when wearing glasses/contacts?: Yes Does the patient have difficulty concentrating, remembering, or making decisions?: No Patient able to express need for assistance with ADLs?: Yes Does the patient have difficulty dressing or bathing?: Yes Independently performs ADLs?: No Communication: Independent Dressing (OT): Needs assistance Is this a change from baseline?: Pre-admission baseline Grooming: Needs assistance Is this a change from baseline?: Pre-admission baseline Feeding: Independent Bathing: Needs assistance Is this a change from baseline?: Pre-admission baseline Toileting: Needs assistance Is this a change from baseline?: Pre-admission baseline In/Out Bed: Needs assistance Is this a change from baseline?: Pre-admission baseline Walks in Home: Needs assistance Does the patient have difficulty walking or climbing stairs?: Yes Weakness of Legs: Both Weakness of Arms/Hands: Both  Permission Sought/Granted Permission sought to share information with : Family Supports    Share Information with NAME: teresa  Permission granted to share info w AGENC[facility]  Permission granted to share info w Relationship: daughter     Emotional Assessment Appearance:: Appears stated age Attitude/Demeanor/Rapport: Engaged Affect (typically observed): Accepting,Appropriate Orientation: : Oriented to Self,Oriented to Place,Oriented to  Time,Oriented to Situation Alcohol / Substance Use: Not Applicable Psych Involvement: No (comment)  Admission diagnosis:   Hyponatremia [E87.1] Hypertensive urgency [I16.0] Non-intractable vomiting with nausea, unspecified vomiting type [R11.2] Patient Active Problem List   Diagnosis Date Noted  . Hyponatremia 03/10/2020  . Hypertensive urgency 03/10/2020  . CHF (congestive heart failure) ([place])   . Chronic kidney disease   . Thyroid disease   . Obesity   . Hypertension   . GERD (gastroesophageal reflux disease)   . Iron deficiency anemia 05/14/2019   PCP:  Idelle Crouch, MD Pharmacy:   [id number] - [place], [address] [place] [address] Phone: [phone] Fax: [phone]     Social Determinants of Health (SDOH) Interventions    Readmission Risk Interventions No flowsheet data found.

## 2020-03-14 NOTE — Progress Notes (Signed)
[facility]  ROUNDING NOTE   Subjective:  Patient resting comfortably in bed. Serum sodium now up to 132. It appears that tolvaptan was efficacious.   Objective:  Vital signs in last 24 hours:  Temp:  [97.8 F (36.6 C)-98.7 F (37.1 C)] 98.3 F (36.8 C) (12/28 0404) Pulse Rate:  [63-74] 68 (12/28 0940) Resp:  [16-20] 20 (12/28 0940) BP: (137-178)/(44-59) 164/57 (12/28 0940) SpO2:  [95 %-98 %] 98 % (12/28 0940)  Weight change:  Filed Weights   03/11/20 0420 03/12/20 0517 03/13/20 0427  Weight: 79.9 kg 80.6 kg 82.1 kg    Intake/Output: I/O last 3 completed shifts: In: [id number] [P.O.:1220] Out: 2450 [Urine:2450]   Intake/Output this shift:  Total I/O In: 360 [P.O.:360] Out: -   Physical Exam: General:  No acute distress  Head:  Normocephalic, atraumatic. Moist oral mucosal membranes  Eyes:  Anicteric  Neck:  Supple  Lungs:   Clear to auscultation, normal effort  Heart:  S1S2 no rubs  Abdomen:   Soft, nontender, bowel sounds present  Extremities:  No peripheral edema.  Neurologic:  Awake, alert, following commands  Skin:  No lesions  Access:  No dialysis access present.    Basic Metabolic Panel: Recent Labs  Lab 03/10/20 0617 03/10/20 0755 03/11/20 0541 03/12/20 0529 03/12/20 1018 03/12/20 1619 03/12/20 2232 03/13/20 0513 03/13/20 1620 03/14/20 0607  NA 123*  --  126* 124*   < > 121* 122* 123* 125* 132*  K 4.8  --  4.5 4.2  --   --   --  4.3 3.8  --   CL 89*  --  92* 93*  --   --   --  91* 94*  --   CO2 25  --  25 25  --   --   --  27 25  --   GLUCOSE 126*  --  100* 98  --   --   --  109* 146*  --   BUN 18  --  18 12  --   --   --  13 13  --   CREATININE 1.11*  --  0.93 0.61  --   --   --  0.74 0.74  --   CALCIUM 9.3  --  8.7* 8.6*  --   --   --  8.1* 8.1*  --   MG  --  2.0  --  2.1  --   --   --   --   --   --    < > = values in this interval not displayed.    Liver Function Tests: Recent Labs  Lab 03/10/20 0617 03/13/20 1620  AST  31 28  ALT 15 13  ALKPHOS 72 55  BILITOT 0.9 0.7  PROT 6.7 4.8*  ALBUMIN 4.1 2.8*   Recent Labs  Lab 03/10/20 0617  LIPASE 32   No results for input(s): AMMONIA in the last 168 hours.  CBC: Recent Labs  Lab 03/10/20 0617 03/11/20 0541 03/12/20 0529  WBC 13.0* 8.4 6.4  HGB 11.0* 10.1* 10.1*  HCT 31.4* 29.1* 27.9*  MCV 91.0 93.0 92.1  PLT 395 279 243    Cardiac Enzymes: No results for input(s): CKTOTAL, CKMB, CKMBINDEX, TROPONINI in the last 168 hours.  BNP: Invalid input(s): POCBNP  CBG: No results for input(s): GLUCAP in the last 168 hours.  Microbiology: Results for orders placed or performed during the hospital encounter of 03/10/20  Culture, blood (Routine X 2) w  Reflex to ID Panel     Status: None (Preliminary result)   Collection Time: 03/10/20 10:19 AM   Specimen: BLOOD  Result Value Ref Range Status   Specimen Description BLOOD BLOOD RIGHT WRIST  Final   Special Requests   Final    BOTTLES DRAWN AEROBIC AND ANAEROBIC Blood Culture adequate volume   Culture   Final    NO GROWTH 4 DAYS Performed at [facility], [address] [place], [place] [address]    Report Status PENDING  Incomplete  Culture, blood (Routine X 2) w Reflex to ID Panel     Status: None (Preliminary result)   Collection Time: 03/10/20 10:19 AM   Specimen: BLOOD  Result Value Ref Range Status   Specimen Description BLOOD RIGHT ANTECUBITAL  Final   Special Requests   Final    BOTTLES DRAWN AEROBIC AND ANAEROBIC Blood Culture adequate volume   Culture   Final    NO GROWTH 4 DAYS Performed at [facility], [address] [place], [place] [address]    Report Status PENDING  Incomplete    Coagulation Studies: No results for input(s): LABPROT, INR in the last 72 hours.  Urinalysis: No results for input(s): COLORURINE, LABSPEC, PHURINE, GLUCOSEU, HGBUR, BILIRUBINUR, KETONESUR, PROTEINUR, UROBILINOGEN, NITRITE, LEUKOCYTESUR in the last 72 hours.  Invalid  input(s): APPERANCEUR    Imaging: No results found.   Medications:    . ALPRAZolam  0.25 mg Oral TID  . apixaban  5 mg Oral BID  . carvedilol  25 mg Oral Q12H  . cloNIDine  0.1 mg Oral Daily  . cloNIDine  0.2 mg Oral Daily  . cloNIDine  0.3 mg Oral QHS  . feeding supplement  237 mL Oral BID BM  . gabapentin  300 mg Oral QHS  . hydrALAZINE  50 mg Oral TID  . levothyroxine  75 mcg Oral QAC breakfast  . linaclotide  290 mcg Oral Daily  . losartan  100 mg Oral QHS  . magnesium oxide  400 mg Oral BID  . pantoprazole  40 mg Oral Daily  . pravastatin  80 mg Oral q1800  . prazosin  2 mg Oral BID  . senna-docusate  1 tablet Oral BID  . verapamil  180 mg Oral Q lunch   acetaminophen, bisacodyl, hydrALAZINE, LORazepam  Assessment/ Plan:  86 y.o. female with past medical history of COVID-19 infection in December 2021, atrial fibrillation, coronary disease, hypertension, congestive heart failure, hyperlipidemia, hypothyroidism who was admitted for hypertensive urgency, nausea/vomiting, and found to have hyponatremia.  1.  Acute on chronic hyponatremia.  Serum sodium was found be  129-133 in November 2021.   -Patient responded well to tolvaptan.  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  Serum sodium up to 132.  Hold off on additional tolvaptan.  Counseled the patient to maintain free water restriction of 1.2 L/day.  2.  Blood pressure currently blood pressure currently 164/57.  Continue carvedilol, clonidine, hydralazine, losartan, verapamil, prazosin.   LOS: 4 Kimbery Harwood 12/28/202111:08 AM

## 2020-03-14 NOTE — Discharge Summary (Addendum)
Physician Discharge Summary  KESTRA ARMISTEAD [id number] DOB: 1931-05-27 DOA: 03/10/2020  PCP: Idelle Crouch, MD  Admit date: 03/10/2020 Discharge date: 04/02/2020  Admitted From: home Disposition:  home  Recommendations for Outpatient Follow-up:  Follow up with PCP in 1-2 weeks Please obtain BMP/CBC in one week   Home Health: PT, OT  Equipment/Devices: rolling walker   Discharge Condition: stable  CODE STATUS: full  Diet recommendation: Heart Healthy / 1200 cc/day fluid restriction  Discharge Diagnoses: Principal Problem:   Hyponatremia Active Problems:   CHF (congestive heart failure) ([place])   Chronic kidney disease   Thyroid disease   Obesity   Hypertension   GERD (gastroesophageal reflux disease)   Hypertensive urgency    Summary of HPI and Hospital Course:  Andrea Haley is a 86 y.o. female with medical history significant for recent Covid 19 viral infection(Covid PCR test was positive on 02/18/20) patient had predominantly gastrointestinal symptoms which include nausea, vomiting and anorexia, history of A. fib, coronary artery disease, hypertension, diastolic CHF and thyroid disease who presents to the ER via EMS for evaluation of multiple symptoms which include generalized weakness, nausea, vomiting and epigastric abdominal pain.  Pt reportedly has had poor p.o. intake with episodes of nausea vomiting since her Covid diagnosis.  Also with constipation and last BM of over a week prior.  Patient has been unable to keep down food or liquids for over a week.  Evaluation in the ED showed hyponatremia with sodium 123, normal troponin x2, mild leukocytosis 13k.  CT of abdomen pelvis was negative for anything acute.  CTA of the chest negative for PE and showed improved patchy airspace opacities consistent with recent Covid pneumonia.    Admitted to hospitalist service for further evaluation and management.      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  Initially  attributed to  likely due to poor p.o. intake, but did not improve on IV fluids.  Patient has chronic mild hyponatremia with baseline sodium level is in the low 130s.  Per PCP note of 12/21, patient was to be started on Paxil.  This is not on her med history this admission, unclear if it was started. Nephrology was consulted and treated patient with tolvaptan with good response, sodium improved. Fluid restriction - pt and daughter educated on this and express understanding.   Acute gastritis -suspect sequelae of recent COVID-19 infection.   Hx of Covid-19 infection - no longer actively infected. Patient's diet was started with clear liquids and advanced, tolerating usual diet at discharge. Ativan works best for patient's nausea.  Zofran PRN. Treated with IV PPI during admission.    Hypertensive urgency -present on admission and due to patient's inability to keep down her meds so not getting her antihypertensives.  Now tolerating oral meds and home regimen has been resumed.   PCP Follow up.   Constipation -  Resolved.  Chronic issue, no BM for over a week at time of admission.   Resumed on home bowel regimen including Linzess.   Added scheduled senna-docusate twice a day with PRN suppository.     Hypothyroidism -continue Synthroid.  TSH is normal   Generalized anxiety -chronic, stable.  Continue as needed Xanax.   Paroxysmal A. Fib -rate controlled.  Continue Coreg and Eliquis.   Chronic diastolic CHF - well compensated. Continue Coreg, Lasix, Aldactone.  Daily weights to monitor volume status.   Discharge Instructions   Discharge Instructions     (HEART FAILURE PATIENTS) Call  MD:  Anytime you have any of the following symptoms: 1) 3 pound weight gain in 24 hours or 5 pounds in 1 week 2) shortness of breath, with or without a dry hacking cough 3) swelling in the hands, feet or stomach 4) if you have to sleep on extra pillows at night in order to breathe.   Complete by: As directed     Call MD for:  extreme fatigue   Complete by: As directed    Call MD for:  persistant dizziness or light-headedness   Complete by: As directed    Call MD for:  persistant nausea and vomiting   Complete by: As directed    Call MD for:  severe uncontrolled pain   Complete by: As directed    Call MD for:  temperature >100.4   Complete by: As directed    Diet - low sodium heart healthy   Complete by: As directed    1200 mL/day fluid restriction   Discharge instructions   Complete by: As directed    Your low sodium level is due to condition called SIADH. The treatment to prevent your sodium from dropping too low is fluid restriction.  Please keep fluid consumption restricted to 1200 mL (1.2 L) per day, or approximately 40 oz.  Please have primary care doctor recheck labs in about a week.   Increase activity slowly   Complete by: As directed       Allergies as of 03/14/2020       Reactions   Amlodipine    Codeine    Cortisone    Hydrochlorothiazide Other (See Comments)   Confusion   Lisinopril    Phenergan [promethazine]    Prednisone    Spironolactone    Tikosyn [dofetilide]         Medication List     TAKE these medications    acetaminophen 500 MG tablet Commonly known as: TYLENOL Take 500-1,000 mg by mouth every 6 (six) hours as needed for mild pain or fever.   ALPRAZolam 0.25 MG tablet Commonly known as: XANAX Take 0.25 mg by mouth 3 (three) times daily as needed for anxiety or sleep.   apixaban 5 MG Tabs tablet Commonly known as: ELIQUIS Take 5 mg by mouth every 12 (twelve) hours.   carvedilol 25 MG tablet Commonly known as: COREG Take 25 mg by mouth every 12 (twelve) hours.   cloNIDine 0.1 MG tablet Commonly known as: CATAPRES Take 0.1-0.3 mg by mouth See admin instructions. Take 1 tablet (0.1mg ) by mouth every morning, take 2 tablets (0.2mg ) by mouth at lunchtime and take 3 tablets (0.3mg ) by mouth every night   docusate sodium 100 MG  capsule Commonly known as: COLACE Take 100 mg by mouth daily.   feeding supplement Liqd Take 237 mLs by mouth 2 (two) times daily between meals.   furosemide 40 MG tablet Commonly known as: LASIX Take 40 mg by mouth See admin instructions. Take 1 tablet (40mg ) by mouth daily -- take 1 additional tablet (40mg ) daily for excessive fluid gain   gabapentin 300 MG capsule Commonly known as: NEURONTIN Take 300 mg by mouth at bedtime.   hydrALAZINE 50 MG tablet Commonly known as: APRESOLINE Take 50 mg by mouth 3 (three) times daily.   lansoprazole 15 MG capsule Commonly known as: PREVACID Take 15 mg by mouth 2 (two) times daily.   levothyroxine 75 MCG tablet Commonly known as: SYNTHROID Take 75 mcg by mouth daily before breakfast. Take qd on empty  stomach with a glass of water at least 30-60 minutes before breakfast   linaclotide 290 MCG Caps capsule Commonly known as: LINZESS Take 290 mcg by mouth daily.   losartan 100 MG tablet Commonly known as: COZAAR Take 100 mg by mouth at bedtime.   magnesium oxide 400 MG tablet Commonly known as: MAG-OX Take 400 mg by mouth 2 (two) times daily.   metoCLOPramide 5 MG tablet Commonly known as: REGLAN Take 5 mg by mouth 3 (three) times daily before meals.   ondansetron 4 MG tablet Commonly known as: ZOFRAN Take 4 mg by mouth every 8 (eight) hours as needed for nausea/vomiting.   OVER THE COUNTER MEDICATION Take 1 tablet by mouth as directed. Legatrin  Acetaminophen/diphenhydramine (500/50)   pravastatin 80 MG tablet Commonly known as: PRAVACHOL Take 80 mg by mouth at bedtime.   prazosin 1 MG capsule Commonly known as: MINIPRESS Take 2 mg by mouth 2 (two) times daily.   verapamil 180 MG CR tablet Commonly known as: CALAN-SR Take 180 mg by mouth daily with lunch.        Follow-up Information     [facility] Follow up.   Specialty: Home Health Services Why: Physical Therapy, Occupational  Therapy Contact information: [address] [place] [address] [phone]                Allergies  Allergen Reactions   Amlodipine    Codeine    Cortisone    Hydrochlorothiazide Other (See Comments)    Confusion   Lisinopril    Phenergan [Promethazine]    Prednisone    Spironolactone    Tikosyn [Dofetilide]     Consultations: Nephrology    Procedures/Studies: DG Chest 2 View  Result Date: 03/10/2020 CLINICAL DATA:  Shortness of breath and chest pain. EXAM: CHEST - 2 VIEW COMPARISON:  03/31/2013 FINDINGS: Cardiopericardial silhouette is at upper limits of normal for size. Patchy airspace disease is seen in the right upper lung and retrocardiac left base. No substantial pleural effusion. The visualized bony structures of the thorax show no acute abnormality. IMPRESSION: Patchy airspace opacity in the right upper lung and retrocardiac left base. Imaging features compatible with multifocal pneumonia. Follow-up imaging recommended to ensure resolution. Electronically Signed   By: Kennith Center M.D.   On: 03/10/2020 07:12   CT Head Wo Contrast  Result Date: 03/10/2020 CLINICAL DATA:  Headaches with nausea and vomiting EXAM: CT HEAD WITHOUT CONTRAST TECHNIQUE: Contiguous axial images were obtained from the base of the skull through the vertex without intravenous contrast. COMPARISON:  None. FINDINGS: Brain: Mild atrophic changes are noted. No findings to suggest acute hemorrhage, acute infarction or space-occupying mass lesion are seen. Vascular: No hyperdense vessel or unexpected calcification. Skull: Normal. Negative for fracture or focal lesion. Sinuses/Orbits: No acute finding. Other: None. IMPRESSION: Mild atrophic changes without acute abnormality. Electronically Signed   By: Alcide Clever M.D.   On: 03/10/2020 10:29   CT Angio Chest PE W and/or Wo Contrast  Result Date: 03/10/2020 CLINICAL DATA:  Chest pain and shortness of breath 75 mL Omnipaque 350. EXAM:  CT ANGIOGRAPHY CHEST WITH CONTRAST TECHNIQUE: Multidetector CT imaging of the chest was performed using the standard protocol during bolus administration of intravenous contrast. Multiplanar CT image reconstructions and MIPs were obtained to evaluate the vascular anatomy. CONTRAST:  63mL OMNIPAQUE IOHEXOL 350 MG/ML SOLN COMPARISON:  Chest x-ray from earlier in the same day. FINDINGS: Cardiovascular: Thoracic aorta demonstrates atherosclerotic  calcifications without aneurysmal dilatation. Heart is at the upper limits of normal in size. No coronary calcifications are noted. The pulmonary artery shows a normal branching pattern. No focal filling defect is identified to suggest pulmonary embolism. Mediastinum/Nodes: Thoracic inlet is within normal limits. No hilar or mediastinal adenopathy is noted. The esophagus as visualized is within normal limits. Lungs/Pleura: Lungs are well aerated bilaterally. Patchy ground-glass opacities are noted similar to that seen on prior CT examination but slightly improved. No new focal infiltrate or sizable effusion is seen. No parenchymal nodules are noted. Upper Abdomen: Visualized upper abdomen shows no acute abnormality. Musculoskeletal: Bilateral partially calcified breast implants are noted. Degenerative changes of the thoracic spine are noted. No acute bony abnormality is noted. Review of the MIP images confirms the above findings. IMPRESSION: No evidence of pulmonary emboli. Persistent but improved patchy airspace opacities consistent with the given clinical history of prior COVID-19 infection. No new focal abnormality is noted. Aortic Atherosclerosis (ICD10-I70.0). Electronically Signed   By: Inez Catalina M.D.   On: 03/10/2020 10:33   CT ABDOMEN PELVIS W CONTRAST  Result Date: 03/10/2020 CLINICAL DATA:  Nausea and vomiting EXAM: CT ABDOMEN AND PELVIS WITH CONTRAST TECHNIQUE: Multidetector CT imaging of the abdomen and pelvis was performed using the standard protocol  following bolus administration of intravenous contrast. CONTRAST:  61mL OMNIPAQUE IOHEXOL 350 MG/ML SOLN COMPARISON:  11/11/2018 FINDINGS: Lower chest:  Dedicated chest CT reported separately. Hepatobiliary: No focal liver abnormality.Cholelithiasis. The gallbladder is full but there is no evidence of cholecystitis. No bile duct dilatation. Pancreas: Unremarkable. Spleen: Unremarkable. Adrenals/Urinary Tract: Negative adrenals. No hydronephrosis or stone. 8 mm left upper pole renal lesion which is greater than water density but not enlarged from 2020 when it had a similar density. No de-enhancement is detected on the delayed phase, favor inconsequential proteinaceous cyst. Unremarkable bladder. Stomach/Bowel: No obstruction. No evidence of bowel inflammation. Minimal wispy density along the anti mesenteric border of the distal transverse colon without underlying diverticulum or discrete epiploic appendage. Vascular/Lymphatic: Diffuse atheromatous calcification of the aorta and coronaries. No mass or adenopathy. Reproductive:Hysterectomy. Other: No ascites or pneumoperitoneum. Mild presacral fat stranding, likely dependent edema. No neighboring bowel thickening or fracture. Musculoskeletal: No acute abnormalities. IMPRESSION: 1. No acute finding.  No bowel obstruction or wall thickening. 2. Cholelithiasis. 3.  Aortic Atherosclerosis (ICD10-I70.0). Electronically Signed   By: Monte Fantasia M.D.   On: 03/10/2020 10:38       Subjective: Pt seen this AM, reports feeling much better.  Able to eat a good breakfast, no nausea or vomiting or other complaints.  Asks to go home. Discussed fluid restriction again.   Discharge Exam: Vitals:   03/14/20 0940 03/14/20 1150  BP: (!) 164/57 (!) 141/52  Pulse: 68 66  Resp: 20 16  Temp:  98.3 F (36.8 C)  SpO2: 98% 97%   Vitals:   03/13/20 2328 03/14/20 0404 03/14/20 0940 03/14/20 1150  BP: (!) 167/57 (!) 140/44 (!) 164/57 (!) 141/52  Pulse: 67 65 68 66  Resp:  20 20 20 16   Temp: 98.4 F (36.9 C) 98.3 F (36.8 C)  98.3 F (36.8 C)  TempSrc: Oral Oral  Oral  SpO2: 97% 95% 98% 97%  Weight:      Height:        General: Pt is alert, awake, not in acute distress Cardiovascular: RRR, S1/S2 +, no rubs, no gallops Respiratory: CTA bilaterally, no wheezing, no rhonchi Abdominal: Soft, NT, ND, bowel sounds + Extremities: no edema, no  cyanosis    The results of significant diagnostics from this hospitalization (including imaging, microbiology, ancillary and laboratory) are listed below for reference.     Microbiology: No results found for this or any previous visit (from the past 240 hour(s)).   Labs: BNP (last 3 results) No results for input(s): BNP in the last 8760 hours. Basic Metabolic Panel: No results for input(s): NA, K, CL, CO2, GLUCOSE, BUN, CREATININE, CALCIUM, MG, PHOS in the last 168 hours. Liver Function Tests: No results for input(s): AST, ALT, ALKPHOS, BILITOT, PROT, ALBUMIN in the last 168 hours. No results for input(s): LIPASE, AMYLASE in the last 168 hours. No results for input(s): AMMONIA in the last 168 hours. CBC: No results for input(s): WBC, NEUTROABS, HGB, HCT, MCV, PLT in the last 168 hours. Cardiac Enzymes: No results for input(s): CKTOTAL, CKMB, CKMBINDEX, TROPONINI in the last 168 hours. BNP: Invalid input(s): POCBNP CBG: No results for input(s): GLUCAP in the last 168 hours. D-Dimer No results for input(s): DDIMER in the last 72 hours. Hgb A1c No results for input(s): HGBA1C in the last 72 hours. Lipid Profile No results for input(s): CHOL, HDL, LDLCALC, TRIG, CHOLHDL, LDLDIRECT in the last 72 hours. Thyroid function studies No results for input(s): TSH, T4TOTAL, T3FREE, THYROIDAB in the last 72 hours.  Invalid input(s): FREET3 Anemia work up No results for input(s): VITAMINB12, FOLATE, FERRITIN, TIBC, IRON, RETICCTPCT in the last 72 hours. Urinalysis    Component Value Date/Time   COLORURINE YELLOW  (A) 03/10/2020 0609   APPEARANCEUR CLEAR (A) 03/10/2020 0609   LABSPEC 1.033 (H) 03/10/2020 0609   PHURINE 5.0 03/10/2020 0609   GLUCOSEU NEGATIVE 03/10/2020 0609   HGBUR NEGATIVE 03/10/2020 0609   BILIRUBINUR NEGATIVE 03/10/2020 0609   KETONESUR NEGATIVE 03/10/2020 0609   PROTEINUR NEGATIVE 03/10/2020 0609   NITRITE NEGATIVE 03/10/2020 0609   LEUKOCYTESUR NEGATIVE 03/10/2020 0609   Sepsis Labs Invalid input(s): PROCALCITONIN,  WBC,  LACTICIDVEN Microbiology No results found for this or any previous visit (from the past 240 hour(s)).   Time coordinating discharge: Over 30 minutes  SIGNED:   Ezekiel Slocumb, DO [facility] 04/02/2020, 9:26 AM   If 7PM-7AM, please contact night-coverage [email or web address]

## 2020-03-15 LAB — CULTURE, BLOOD (ROUTINE X 2)
Culture: NO GROWTH
Culture: NO GROWTH
Special Requests: ADEQUATE
Special Requests: ADEQUATE

## 2020-05-22 ENCOUNTER — Other Ambulatory Visit: Payer: Self-pay | Admitting: Oncology

## 2020-05-25 ENCOUNTER — Inpatient Hospital Stay: Payer: Medicare PPO | Attending: Oncology

## 2020-05-25 DIAGNOSIS — N189 Chronic kidney disease, unspecified: Secondary | ICD-10-CM | POA: Insufficient documentation

## 2020-05-25 DIAGNOSIS — E785 Hyperlipidemia, unspecified: Secondary | ICD-10-CM | POA: Insufficient documentation

## 2020-05-25 DIAGNOSIS — I4891 Unspecified atrial fibrillation: Secondary | ICD-10-CM | POA: Insufficient documentation

## 2020-05-25 DIAGNOSIS — D509 Iron deficiency anemia, unspecified: Secondary | ICD-10-CM | POA: Diagnosis not present

## 2020-05-25 DIAGNOSIS — D649 Anemia, unspecified: Secondary | ICD-10-CM

## 2020-05-25 DIAGNOSIS — I2510 Atherosclerotic heart disease of native coronary artery without angina pectoris: Secondary | ICD-10-CM | POA: Insufficient documentation

## 2020-05-25 DIAGNOSIS — I130 Hypertensive heart and chronic kidney disease with heart failure and stage 1 through stage 4 chronic kidney disease, or unspecified chronic kidney disease: Secondary | ICD-10-CM | POA: Diagnosis not present

## 2020-05-25 DIAGNOSIS — Z7901 Long term (current) use of anticoagulants: Secondary | ICD-10-CM | POA: Diagnosis not present

## 2020-05-25 DIAGNOSIS — R5381 Other malaise: Secondary | ICD-10-CM | POA: Diagnosis not present

## 2020-05-25 DIAGNOSIS — Z8616 Personal history of COVID-19: Secondary | ICD-10-CM | POA: Diagnosis not present

## 2020-05-25 DIAGNOSIS — Z79899 Other long term (current) drug therapy: Secondary | ICD-10-CM | POA: Insufficient documentation

## 2020-05-25 DIAGNOSIS — I252 Old myocardial infarction: Secondary | ICD-10-CM | POA: Insufficient documentation

## 2020-05-25 DIAGNOSIS — K219 Gastro-esophageal reflux disease without esophagitis: Secondary | ICD-10-CM | POA: Insufficient documentation

## 2020-05-25 DIAGNOSIS — R531 Weakness: Secondary | ICD-10-CM | POA: Insufficient documentation

## 2020-05-25 DIAGNOSIS — R5383 Other fatigue: Secondary | ICD-10-CM | POA: Insufficient documentation

## 2020-05-25 LAB — CBC WITH DIFFERENTIAL/PLATELET
Abs Immature Granulocytes: 0.01 10*3/uL (ref 0.00–0.07)
Basophils Absolute: 0 10*3/uL (ref 0.0–0.1)
Basophils Relative: 1 %
Eosinophils Absolute: 0.1 10*3/uL (ref 0.0–0.5)
Eosinophils Relative: 3 %
HCT: 32 % — ABNORMAL LOW (ref 36.0–46.0)
Hemoglobin: 10.7 g/dL — ABNORMAL LOW (ref 12.0–15.0)
Immature Granulocytes: 0 %
Lymphocytes Relative: 20 %
Lymphs Abs: 1 10*3/uL (ref 0.7–4.0)
MCH: 32 pg (ref 26.0–34.0)
MCHC: 33.4 g/dL (ref 30.0–36.0)
MCV: 95.8 fL (ref 80.0–100.0)
Monocytes Absolute: 0.4 10*3/uL (ref 0.1–1.0)
Monocytes Relative: 9 %
Neutro Abs: 3.2 10*3/uL (ref 1.7–7.7)
Neutrophils Relative %: 67 %
Platelets: 217 10*3/uL (ref 150–400)
RBC: 3.34 MIL/uL — ABNORMAL LOW (ref 3.87–5.11)
RDW: 12.2 % (ref 11.5–15.5)
WBC: 4.7 10*3/uL (ref 4.0–10.5)
nRBC: 0 % (ref 0.0–0.2)

## 2020-05-25 LAB — IRON AND TIBC
Iron: 65 ug/dL (ref 28–170)
Saturation Ratios: 19 % (ref 10.4–31.8)
TIBC: 340 ug/dL (ref 250–450)
UIBC: 275 ug/dL

## 2020-05-25 LAB — FERRITIN: Ferritin: 81 ng/mL (ref 11–307)

## 2020-05-26 ENCOUNTER — Inpatient Hospital Stay: Payer: Medicare PPO

## 2020-05-26 ENCOUNTER — Inpatient Hospital Stay: Payer: Medicare PPO | Admitting: Oncology

## 2020-05-26 ENCOUNTER — Encounter: Payer: Self-pay | Admitting: Oncology

## 2020-05-26 VITALS — BP 149/52 | HR 60 | Temp 97.9°F | Wt 174.3 lb

## 2020-05-26 DIAGNOSIS — D509 Iron deficiency anemia, unspecified: Secondary | ICD-10-CM

## 2020-05-26 NOTE — Progress Notes (Signed)
[place]  Telephone:(336) [phone] Fax:(336) [phone]  ID: Andrea Haley OB: 07-24-1931  MR#: [id number]  FGH#:[id number]  Andrea Haley Care Team: Idelle Crouch, MD as PCP - General (Internal Medicine)  CHIEF COMPLAINT: Iron deficiency anemia.  INTERVAL HISTORY: Andrea Haley returns to clinic today for repeat laboratory work, further evaluation, and consideration of additional IV Feraheme.  Andrea Haley was last seen in clinic on 01/21/2020.  In the interim Andrea Haley was hospitalized from 03/10/2020-03/14/2020 for hyponatremia secondary to fluid loss from recent COVID-19 infection.  Andrea Haley continues to feel well and remains asymptomatic.  Andrea Haley feels Andrea Haley has completely recovered from Covid.  Andrea Haley has stable weakness and fatigue.  Andrea Haley has a good appetite and denies weight loss.  Andrea Haley has no neurologic complaints.  Andrea Haley denies any recent fevers or illnesses.  Andrea Haley has no chest pain, shortness of breath, cough, or hemoptysis.  Andrea Haley denies any nausea, vomiting, constipation, or diarrhea.  Andrea Haley denies any melena or hematochezia.  Andrea Haley has no urinary complaints.  Andrea Haley feels at her baseline and offers no specific complaints today.  REVIEW OF SYSTEMS:   Review of Systems  Constitutional: Positive for malaise/fatigue. Negative for fever and weight loss.  Respiratory: Negative.  Negative for cough and shortness of breath.   Cardiovascular: Negative.  Negative for chest pain and leg swelling.  Gastrointestinal: Negative.  Negative for abdominal pain, blood in stool and melena.  Genitourinary: Negative.  Negative for hematuria.  Musculoskeletal: Negative.  Negative for back pain.  Skin: Negative.  Negative for rash.  Neurological: Positive for weakness. Negative for dizziness, focal weakness and headaches.  Psychiatric/Behavioral: Negative.  The Andrea Haley is not nervous/anxious.     As per HPI. Otherwise, a complete review of systems is negative.  PAST MEDICAL HISTORY: Past Medical History:  Diagnosis Date   . Anemia   . Aneurysm of left radial artery ([place])   . CHF (congestive heart failure) ([place])   . Chronic back pain   . Chronic kidney disease   . Coronary artery disease   . Dysrhythmia    A-fib, ventricular ectopy with palpitations  . Fibrocystic breast disease   . GERD (gastroesophageal reflux disease)   . Heart murmur   . Hyperlipidemia   . Hypertension   . Ischemic colitis ([place])   . Myocardial infarction ([place])   . Obesity   . Peptic ulcer disease   . Renovascular hypertension   . Thyroid disease   . Venous stasis     PAST SURGICAL HISTORY: Past Surgical History:  Procedure Laterality Date  . ABDOMINAL HYSTERECTOMY    . CARDIAC ELECTROPHYSIOLOGY MAPPING AND ABLATION    . COLONOSCOPY WITH PROPOFOL N/A 11/26/2018   Procedure: COLONOSCOPY WITH PROPOFO;  Surgeon: Lollie Sails, MD;  Location: [facility] ENDOSCOPY;  Service: Endoscopy;  Laterality: N/A;  . ESOPHAGOGASTRODUODENOSCOPY (EGD) WITH PROPOFOL N/A 04/28/2019   Procedure: ESOPHAGOGASTRODUODENOSCOPY (EGD) WITH PROPOFOL;  Surgeon: Robert Bellow, MD;  Location: [facility] ENDOSCOPY;  Service: Endoscopy;  Laterality: N/A;  . GIVENS CAPSULE STUDY N/A 05/06/2019   Procedure: GIVENS CAPSULE STUDY;  Surgeon: Robert Bellow, MD;  Location: [facility] ENDOSCOPY;  Service: Endoscopy;  Laterality: N/A;  . HAND DEBRIDEMENT Left 11/2015   Debridement skin/SQ tissue, wrist/hand/finger  . MASTECTOMY Bilateral   . radial artery repair Left 11/2015   aneurysm/pseudoaneurysm repair, graft insertion of radial/ulnar artery    FAMILY HISTORY: No family history on file.  ADVANCED DIRECTIVES (Y/N):  N  HEALTH MAINTENANCE: Social History   Tobacco Use  . Smoking  status: Never Smoker  . Smokeless tobacco: Never Used  Vaping Use  . Vaping Use: Never used  Substance Use Topics  . Alcohol use: Not Currently    Comment: occasional wine qhs  . Drug use: Never     Colonoscopy:  PAP:  Bone density:  Lipid panel:  Allergies  Allergen  Reactions  . Amlodipine   . Codeine   . Cortisone   . Hydrochlorothiazide Other (See Comments)    Confusion  . Lisinopril   . Phenergan [Promethazine]   . Prednisone   . Spironolactone   . Tikosyn [Dofetilide]     Current Outpatient Medications  Medication Sig Dispense Refill  . acetaminophen (TYLENOL) 500 MG tablet Take 500-1,000 mg by mouth every 6 (six) hours as needed for mild pain or fever.    . ALPRAZolam (XANAX) 0.25 MG tablet Take 0.25 mg by mouth 3 (three) times daily as needed for anxiety or sleep.    Marland Kitchen apixaban (ELIQUIS) 5 MG TABS tablet Take 5 mg by mouth every 12 (twelve) hours.    . carvedilol (COREG) 25 MG tablet Take 25 mg by mouth every 12 (twelve) hours.    . cloNIDine (CATAPRES) 0.1 MG tablet Take 0.1-0.3 mg by mouth See admin instructions. Take 1 tablet (0.1mg ) by mouth every morning, take 2 tablets (0.2mg ) by mouth at lunchtime and take 3 tablets (0.3mg ) by mouth every night    . docusate sodium (COLACE) 100 MG capsule Take 100 mg by mouth daily.    . feeding supplement (ENSURE ENLIVE / ENSURE PLUS) LIQD Take 237 mLs by mouth 2 (two) times daily between meals. 237 mL 12  . furosemide (LASIX) 40 MG tablet Take 40 mg by mouth See admin instructions. Take 1 tablet (40mg ) by mouth daily -- take 1 additional tablet (40mg ) daily for excessive fluid gain    . gabapentin (NEURONTIN) 300 MG capsule Take 300 mg by mouth at bedtime.    . hydrALAZINE (APRESOLINE) 50 MG tablet Take 50 mg by mouth 3 (three) times daily.    . lansoprazole (PREVACID) 15 MG capsule Take 15 mg by mouth 2 (two) times daily.    Marland Kitchen levothyroxine (SYNTHROID) 75 MCG tablet Take 75 mcg by mouth daily before breakfast. Take qd on empty stomach with a glass of water at least 30-60 minutes before breakfast    . linaclotide (LINZESS) 290 MCG CAPS capsule Take 290 mcg by mouth daily.    Marland Kitchen losartan (COZAAR) 100 MG tablet Take 100 mg by mouth at bedtime.    . magnesium oxide (MAG-OX) 400 MG tablet Take 400 mg by  mouth 2 (two) times daily.    . metoCLOPramide (REGLAN) 5 MG tablet Take 5 mg by mouth 3 (three) times daily before meals.    . ondansetron (ZOFRAN) 4 MG tablet Take 4 mg by mouth every 8 (eight) hours as needed for nausea/vomiting.    Marland Kitchen OVER THE COUNTER MEDICATION Take 1 tablet by mouth as directed. Legatrin  Acetaminophen/diphenhydramine (500/50)    . pravastatin (PRAVACHOL) 80 MG tablet Take 80 mg by mouth at bedtime.    . prazosin (MINIPRESS) 1 MG capsule Take 2 mg by mouth 2 (two) times daily.    . verapamil (CALAN-SR) 180 MG CR tablet Take 180 mg by mouth daily with lunch.     No current facility-administered medications for this visit.    OBJECTIVE: There were no vitals filed for this visit.   There is no height or weight on file to calculate  BMI.    ECOG FS:0 - Asymptomatic  Physical Exam Constitutional:      Appearance: Normal appearance.  HENT:     Head: Normocephalic and atraumatic.  Eyes:     Pupils: Pupils are equal, round, and reactive to light.  Cardiovascular:     Rate and Rhythm: Normal rate and regular rhythm.     Heart sounds: Normal heart sounds. No murmur heard.   Pulmonary:     Effort: Pulmonary effort is normal.     Breath sounds: Normal breath sounds. No wheezing.  Abdominal:     General: Bowel sounds are normal. There is no distension.     Palpations: Abdomen is soft.     Tenderness: There is no abdominal tenderness.  Musculoskeletal:        General: Normal range of motion.     Cervical back: Normal range of motion.  Skin:    General: Skin is warm and dry.     Findings: No rash.  Neurological:     Mental Status: Andrea Haley is alert and oriented to person, place, and time.  Psychiatric:        Judgment: Judgment normal.      LAB RESULTS:  Lab Results  Component Value Date   NA 132 (L) 03/14/2020   K 3.8 03/13/2020   CL 94 (L) 03/13/2020   CO2 25 03/13/2020   GLUCOSE 146 (H) 03/13/2020   BUN 13 03/13/2020   CREATININE 0.74 03/13/2020    CALCIUM 8.1 (L) 03/13/2020   PROT 4.8 (L) 03/13/2020   ALBUMIN 2.8 (L) 03/13/2020   AST 28 03/13/2020   ALT 13 03/13/2020   ALKPHOS 55 03/13/2020   BILITOT 0.7 03/13/2020   GFRNONAA >60 03/13/2020   GFRAA >60 03/31/2013    Lab Results  Component Value Date   WBC 4.7 05/25/2020   NEUTROABS 3.2 05/25/2020   HGB 10.7 (L) 05/25/2020   HCT 32.0 (L) 05/25/2020   MCV 95.8 05/25/2020   PLT 217 05/25/2020   Lab Results  Component Value Date   IRON 65 05/25/2020   TIBC 340 05/25/2020   IRONPCTSAT 19 05/25/2020   Lab Results  Component Value Date   FERRITIN 81 05/25/2020     STUDIES: No results found.  ASSESSMENT: Iron deficiency anemia.  PLAN:    1.  Iron deficiency anemia:  -Likely secondary to CKD and possible GI bleed. -Had colonoscopy and EGD in 2020 and 2021 that did not reveal source of bleeding. -Andrea Haley also had a capsule endoscopy that was incomplete and Andrea Haley will need to repeat in the future. -Initial work-up from 05/14/2019 showed a normal B12, LDH, haptoglobin, folate, elevated erythropoietin (44.4) with normal reticulocyte count.  Low iron and ferritin levels. -Andrea Haley received 2 doses of IV Feraheme on 05/21/2019 and 05/28/2019. -Lab work from 05/25/2020 shows a hemoglobin of 10.7, ferritin of 81, iron saturation 19%. -No additional iron needed. -RTC in 4 months for repeat labs, MD assessment and possible iron.    2.  Hypertension:  -Chronic and unchanged.   -Continue follow-up and treatment per primary care.  3.  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 -Andrea Haley was seen by nephrology while admitted and was started on tolvaptan. -Currently on a fluid restriction-40 ounces per day  4.  CHF: -On furosemide 40 mg tablets daily  Disposition: -Andrea Haley does not need additional iron today. -Follow-up in 4 months with repeat labs, MD assessment and possible iron.  Greater than 50% was spent in counseling and coordination of  care with this Andrea Haley including but  not limited to discussion of the relevant topics above (See A&P) including, but not limited to diagnosis and management of acute and chronic medical conditions.   Andrea Haley expressed understanding and was in agreement with this plan. Andrea Haley also understands that Andrea Haley can call clinic at any time with any questions, concerns, or complaints.    Jacquelin Hawking, NP   05/26/2020 10:33 AM

## 2020-09-29 ENCOUNTER — Inpatient Hospital Stay: Payer: Medicare PPO | Attending: Oncology

## 2020-09-29 ENCOUNTER — Other Ambulatory Visit: Payer: Self-pay

## 2020-09-29 DIAGNOSIS — Z79899 Other long term (current) drug therapy: Secondary | ICD-10-CM | POA: Insufficient documentation

## 2020-09-29 DIAGNOSIS — Z881 Allergy status to other antibiotic agents status: Secondary | ICD-10-CM | POA: Insufficient documentation

## 2020-09-29 DIAGNOSIS — Z885 Allergy status to narcotic agent status: Secondary | ICD-10-CM | POA: Diagnosis not present

## 2020-09-29 DIAGNOSIS — D509 Iron deficiency anemia, unspecified: Secondary | ICD-10-CM | POA: Diagnosis present

## 2020-09-29 DIAGNOSIS — Z7901 Long term (current) use of anticoagulants: Secondary | ICD-10-CM | POA: Diagnosis not present

## 2020-09-29 DIAGNOSIS — I10 Essential (primary) hypertension: Secondary | ICD-10-CM | POA: Diagnosis not present

## 2020-09-29 DIAGNOSIS — Z888 Allergy status to other drugs, medicaments and biological substances status: Secondary | ICD-10-CM | POA: Diagnosis not present

## 2020-09-29 LAB — COMPREHENSIVE METABOLIC PANEL
ALT: 18 U/L (ref 0–44)
AST: 26 U/L (ref 15–41)
Albumin: 4.3 g/dL (ref 3.5–5.0)
Alkaline Phosphatase: 67 U/L (ref 38–126)
Anion gap: 8 (ref 5–15)
BUN: 21 mg/dL (ref 8–23)
CO2: 27 mmol/L (ref 22–32)
Calcium: 8.9 mg/dL (ref 8.9–10.3)
Chloride: 96 mmol/L — ABNORMAL LOW (ref 98–111)
Creatinine, Ser: 1.09 mg/dL — ABNORMAL HIGH (ref 0.44–1.00)
GFR, Estimated: 49 mL/min — ABNORMAL LOW (ref >60)
Glucose, Bld: 139 mg/dL — ABNORMAL HIGH (ref 70–99)
Potassium: 4.5 mmol/L (ref 3.5–5.1)
Sodium: 131 mmol/L — ABNORMAL LOW (ref 135–145)
Total Bilirubin: 0.7 mg/dL (ref 0.3–1.2)
Total Protein: 6.5 g/dL (ref 6.5–8.1)

## 2020-09-29 LAB — CBC WITH DIFFERENTIAL/PLATELET
Abs Immature Granulocytes: 0.01 10*3/uL (ref 0.00–0.07)
Basophils Absolute: 0 10*3/uL (ref 0.0–0.1)
Basophils Relative: 1 %
Eosinophils Absolute: 0.1 10*3/uL (ref 0.0–0.5)
Eosinophils Relative: 2 %
HCT: 30.9 % — ABNORMAL LOW (ref 36.0–46.0)
Hemoglobin: 10.4 g/dL — ABNORMAL LOW (ref 12.0–15.0)
Immature Granulocytes: 0 %
Lymphocytes Relative: 19 %
Lymphs Abs: 1 10*3/uL (ref 0.7–4.0)
MCH: 32.7 pg (ref 26.0–34.0)
MCHC: 33.7 g/dL (ref 30.0–36.0)
MCV: 97.2 fL (ref 80.0–100.0)
Monocytes Absolute: 0.5 10*3/uL (ref 0.1–1.0)
Monocytes Relative: 9 %
Neutro Abs: 3.7 10*3/uL (ref 1.7–7.7)
Neutrophils Relative %: 69 %
Platelets: 200 10*3/uL (ref 150–400)
RBC: 3.18 MIL/uL — ABNORMAL LOW (ref 3.87–5.11)
RDW: 13.5 % (ref 11.5–15.5)
WBC: 5.4 10*3/uL (ref 4.0–10.5)
nRBC: 0 % (ref 0.0–0.2)

## 2020-09-29 LAB — IRON AND TIBC
Iron: 89 ug/dL (ref 28–170)
Saturation Ratios: 26 % (ref 10.4–31.8)
TIBC: 349 ug/dL (ref 250–450)
UIBC: 260 ug/dL

## 2020-09-29 LAB — FERRITIN: Ferritin: 85 ng/mL (ref 11–307)

## 2020-09-29 LAB — VITAMIN B12: Vitamin B-12: 715 pg/mL (ref 180–914)

## 2020-10-01 LAB — COPPER, SERUM: Copper: 99 ug/dL (ref 80–158)

## 2020-10-03 ENCOUNTER — Inpatient Hospital Stay: Payer: Medicare PPO

## 2020-10-03 ENCOUNTER — Inpatient Hospital Stay: Payer: Medicare PPO | Admitting: Oncology

## 2020-10-03 VITALS — BP 139/54 | HR 56 | Temp 97.0°F | Resp 18 | Wt 177.0 lb

## 2020-10-03 DIAGNOSIS — D509 Iron deficiency anemia, unspecified: Secondary | ICD-10-CM | POA: Diagnosis not present

## 2020-10-03 NOTE — Progress Notes (Signed)
[place]  Telephone:(336) [phone] Fax:(336) [phone]  ID: Andrea Haley OB: March 06, 1932  MR#: [id number]  AOZ#:[id number]  Patient Care Team: Idelle Crouch, MD as PCP - General (Internal Medicine)  CHIEF COMPLAINT: Iron deficiency anemia.  INTERVAL HISTORY: Patient returns to clinic today for repeat laboratory work, further evaluation, and consideration of additional IV iron.  She currently feels well and asymptomatic.  She does not complain of any weakness or fatigue. She has a good appetite and denies weight loss.  She has no neurologic complaints.  She denies any recent fevers or illnesses.  She has no chest pain, shortness of breath, cough, or hemoptysis.  She denies any nausea, vomiting, constipation, or diarrhea.  She denies any melena or hematochezia.  She has no urinary complaints.  Patient offers no specific complaints today.  REVIEW OF SYSTEMS:   Review of Systems  Constitutional: Negative.  Negative for fever, malaise/fatigue and weight loss.  Respiratory: Negative.  Negative for cough and shortness of breath.   Cardiovascular: Negative.  Negative for chest pain and leg swelling.  Gastrointestinal: Negative.  Negative for abdominal pain, blood in stool and melena.  Genitourinary: Negative.  Negative for hematuria.  Musculoskeletal: Negative.  Negative for back pain.  Skin: Negative.  Negative for rash.  Neurological: Negative.  Negative for dizziness, focal weakness, weakness and headaches.  Psychiatric/Behavioral: Negative.  The patient is not nervous/anxious.    As per HPI. Otherwise, a complete review of systems is negative.  PAST MEDICAL HISTORY: Past Medical History:  Diagnosis Date   Anemia    Aneurysm of left radial artery ([facility])    CHF (congestive heart failure) ([facility])    Chronic back pain    Chronic kidney disease    Coronary artery disease    Dysrhythmia    A-fib, ventricular ectopy with palpitations   Fibrocystic breast disease     GERD (gastroesophageal reflux disease)    Heart murmur    Hyperlipidemia    Hypertension    Ischemic colitis ([place])    Myocardial infarction ([place])    Obesity    Peptic ulcer disease    Renovascular hypertension    Thyroid disease    Venous stasis     PAST SURGICAL HISTORY: Past Surgical History:  Procedure Laterality Date   ABDOMINAL HYSTERECTOMY     CARDIAC ELECTROPHYSIOLOGY MAPPING AND ABLATION     COLONOSCOPY WITH PROPOFOL N/A 11/26/2018   Procedure: COLONOSCOPY WITH PROPOFO;  Surgeon: Lollie Sails, MD;  Location: [facility] ENDOSCOPY;  Service: Endoscopy;  Laterality: N/A;   ESOPHAGOGASTRODUODENOSCOPY (EGD) WITH PROPOFOL N/A 04/28/2019   Procedure: ESOPHAGOGASTRODUODENOSCOPY (EGD) WITH PROPOFOL;  Surgeon: Robert Bellow, MD;  Location: [facility] ENDOSCOPY;  Service: Endoscopy;  Laterality: N/A;   GIVENS CAPSULE STUDY N/A 05/06/2019   Procedure: GIVENS CAPSULE STUDY;  Surgeon: Robert Bellow, MD;  Location: [facility] ENDOSCOPY;  Service: Endoscopy;  Laterality: N/A;   HAND DEBRIDEMENT Left 11/2015   Debridement skin/SQ tissue, wrist/hand/finger   MASTECTOMY Bilateral    radial artery repair Left 11/2015   aneurysm/pseudoaneurysm repair, graft insertion of radial/ulnar artery    FAMILY HISTORY: No family history on file.  ADVANCED DIRECTIVES (Y/N):  N  HEALTH MAINTENANCE: Social History   Tobacco Use   Smoking status: Never   Smokeless tobacco: Never  Vaping Use   Vaping Use: Never used  Substance Use Topics   Alcohol use: Not Currently    Comment: occasional wine qhs   Drug use: Never     Colonoscopy:  PAP:  Bone density:  Lipid panel:  Allergies  Allergen Reactions   Nitrofurantoin Other (See Comments)    No appetite/energy   Amlodipine    Codeine    Cortisone    Hydrochlorothiazide Other (See Comments)    Confusion   Lisinopril    Phenergan [Promethazine]    Prednisone    Spironolactone    Tikosyn [Dofetilide]     Current Outpatient Medications   Medication Sig Dispense Refill   acetaminophen (TYLENOL) 500 MG tablet Take 500-1,000 mg by mouth every 6 (six) hours as needed for mild pain or fever.     ALPRAZolam (XANAX) 0.25 MG tablet Take 0.25 mg by mouth 3 (three) times daily as needed for anxiety or sleep.     apixaban (ELIQUIS) 5 MG TABS tablet Take 5 mg by mouth every 12 (twelve) hours.     carvedilol (COREG) 25 MG tablet Take 25 mg by mouth every 12 (twelve) hours.     cloNIDine (CATAPRES) 0.1 MG tablet Take 0.1-0.3 mg by mouth See admin instructions. Take 1 tablet (0.1mg ) by mouth every morning, take 2 tablets (0.2mg ) by mouth at lunchtime and take 3 tablets (0.3mg ) by mouth every night     cyanocobalamin 1000 MCG tablet Take 1,000 mcg by mouth daily.     diphenhydramine-acetaminophen (TYLENOL PM) 25-500 MG TABS tablet Take 1 tablet by mouth at bedtime.     furosemide (LASIX) 40 MG tablet Take 40 mg by mouth See admin instructions. Take 1 tablet (40mg ) by mouth daily -- take 1 additional tablet (40mg ) daily for excessive fluid gain     gabapentin (NEURONTIN) 300 MG capsule Take 300 mg by mouth at bedtime.     hydrALAZINE (APRESOLINE) 50 MG tablet Take 50 mg by mouth 3 (three) times daily.     lansoprazole (PREVACID) 15 MG capsule Take 15 mg by mouth 2 (two) times daily.     levothyroxine (SYNTHROID) 75 MCG tablet Take 75 mcg by mouth daily before breakfast. Take qd on empty stomach with a glass of water at least 30-60 minutes before breakfast     linaclotide (LINZESS) 290 MCG CAPS capsule Take 290 mcg by mouth daily.     losartan (COZAAR) 100 MG tablet Take 100 mg by mouth at bedtime.     magnesium oxide (MAG-OX) 400 MG tablet Take 400 mg by mouth 2 (two) times daily.     pravastatin (PRAVACHOL) 80 MG tablet Take 80 mg by mouth at bedtime.     prazosin (MINIPRESS) 1 MG capsule Take 2 mg by mouth 2 (two) times daily.     Probiotic Product (PROBIOTIC BLEND PO) Take by mouth.     SENNOSIDES PO Take by mouth.     valsartan (DIOVAN) 160  MG tablet Take by mouth.     verapamil (CALAN-SR) 180 MG CR tablet Take 180 mg by mouth daily with lunch.     docusate sodium (COLACE) 100 MG capsule Take 100 mg by mouth daily. (Patient not taking: Reported on 10/03/2020)     feeding supplement (ENSURE ENLIVE / ENSURE PLUS) LIQD Take 237 mLs by mouth 2 (two) times daily between meals. (Patient not taking: Reported on 10/03/2020) 237 mL 12   metoCLOPramide (REGLAN) 5 MG tablet Take 5 mg by mouth 3 (three) times daily before meals. (Patient not taking: Reported on 10/03/2020)     ondansetron (ZOFRAN) 4 MG tablet Take 4 mg by mouth every 8 (eight) hours as needed for nausea/vomiting. (Patient not taking: Reported on 10/03/2020)  No current facility-administered medications for this visit.    OBJECTIVE: Vitals:   10/03/20 1251  BP: (!) 139/54  Pulse: (!) 56  Resp: 18  Temp: (!) 97 F (36.1 C)  SpO2: 99%     Body mass index is 29.45 kg/m.    ECOG FS:0 - Asymptomatic  General: Well-developed, well-nourished, no acute distress. Eyes: Pink conjunctiva, anicteric sclera. HEENT: Normocephalic, moist mucous membranes. Lungs: No audible wheezing or coughing. Heart: Regular rate and rhythm. Abdomen: Soft, nontender, no obvious distention. Musculoskeletal: No edema, cyanosis, or clubbing. Neuro: Alert, answering all questions appropriately. Cranial nerves grossly intact. Skin: No rashes or petechiae noted. Psych: Normal affect.    LAB RESULTS:  Lab Results  Component Value Date   NA 131 (L) 09/29/2020   K 4.5 09/29/2020   CL 96 (L) 09/29/2020   CO2 27 09/29/2020   GLUCOSE 139 (H) 09/29/2020   BUN 21 09/29/2020   CREATININE 1.09 (H) 09/29/2020   CALCIUM 8.9 09/29/2020   PROT 6.5 09/29/2020   ALBUMIN 4.3 09/29/2020   AST 26 09/29/2020   ALT 18 09/29/2020   ALKPHOS 67 09/29/2020   BILITOT 0.7 09/29/2020   GFRNONAA 49 (L) 09/29/2020   GFRAA >60 03/31/2013    Lab Results  Component Value Date   WBC 5.4 09/29/2020   NEUTROABS  3.7 09/29/2020   HGB 10.4 (L) 09/29/2020   HCT 30.9 (L) 09/29/2020   MCV 97.2 09/29/2020   PLT 200 09/29/2020   Lab Results  Component Value Date   IRON 89 09/29/2020   TIBC 349 09/29/2020   IRONPCTSAT 26 09/29/2020   Lab Results  Component Value Date   FERRITIN 85 09/29/2020     STUDIES: No results found.  ASSESSMENT: Iron deficiency anemia.  PLAN:    1.  Iron deficiency anemia: Patient's hemoglobin remains chronically decreased at 10.4, but this is relatively unchanged.  Iron stores continue to be within normal limits.  She reportedly has antibodies making her blood difficult to match, therefore will try to avoid transfusions in the future. Colonoscopy in September 2020 and upper endoscopy in February 2021 did not reveal any distinct pathology.  Patient also had capsule endoscopy that was incomplete and needs to be repeated in the near future.  Previously, the remainder of blood work was either negative or within normal limits. Patient last received treatment on May 28, 2019.  Patient does not require additional IV Feraheme today.  After lengthy discussion with the patient, it was agreed upon that no further follow-up is necessary.  Please refer patient back if there are any questions or concerns. 2.  Hypertension: Chronic and unchanged.  Continue follow-up and treatment per primary care.  I spent a total of 20 minutes reviewing chart data, face-to-face evaluation with the patient, counseling and coordination of care as detailed above.   Patient expressed understanding and was in agreement with this plan. She also understands that She can call clinic at any time with any questions, concerns, or complaints.    Lloyd Huger, MD   10/06/2020 8:12 AM

## 2020-10-06 ENCOUNTER — Encounter: Payer: Self-pay | Admitting: Oncology

## 2020-11-09 IMAGING — CT CT ABDOMEN AND PELVIS WITH CONTRAST
2 of 5 series · 16 of 46 positions shown, 18 images · IV contrast (omnipaque)
Comparison: None.

CLINICAL DATA: Constipation

EXAM:
CT ABDOMEN AND PELVIS WITH CONTRAST
TECHNIQUE: Multidetector CT imaging of the abdomen and pelvis was performed
using the standard protocol following bolus administration of
intravenous contrast.
CONTRAST:  75mL OMNIPAQUE IOHEXOL 300 MG/ML SOLN, additional oral
enteric contrast

[Series 2: abd pelvis · axial · 0.73mm/px · z∈[-1585,-1195]mm · 13 of 88 slices shown, 15 images]
[im 5/88  soft-tissue]
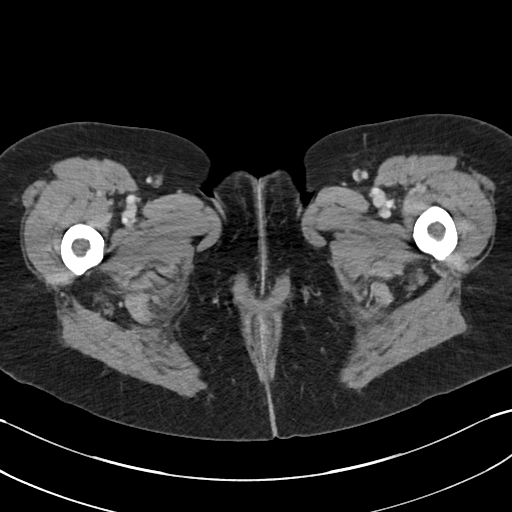
[im 5/88  bone]
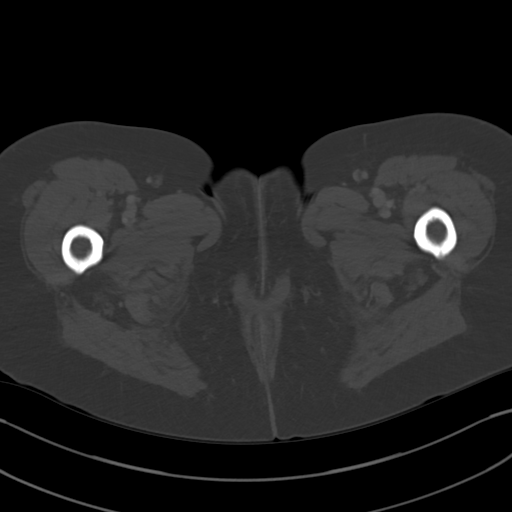
[im 14/88  soft-tissue]
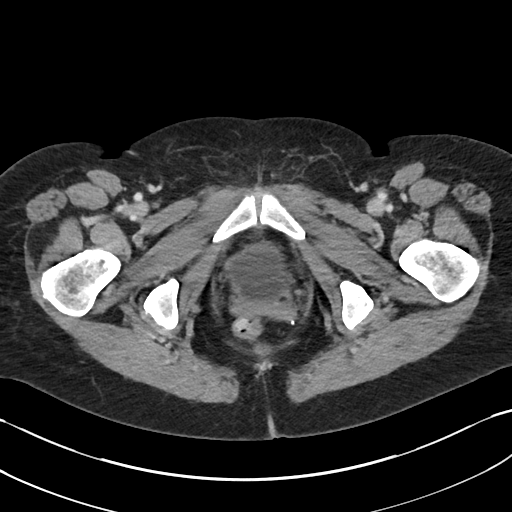
[im 19/88  soft-tissue]
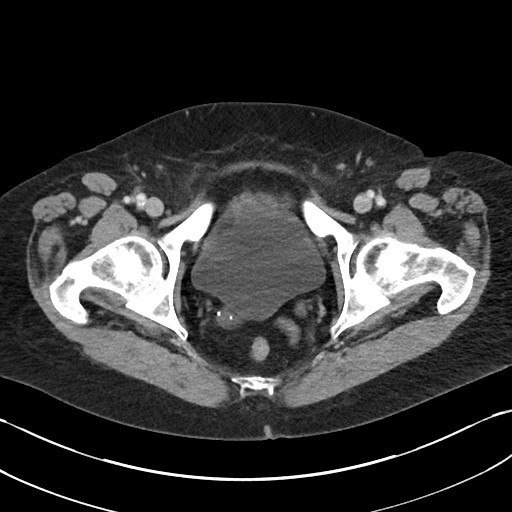
[im 23/88  soft-tissue]
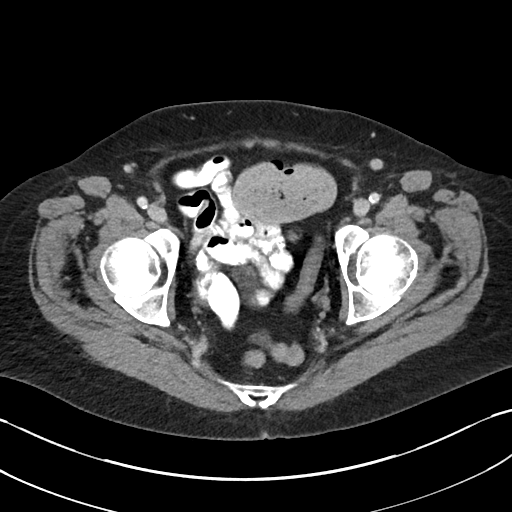
[im 33/88  soft-tissue]
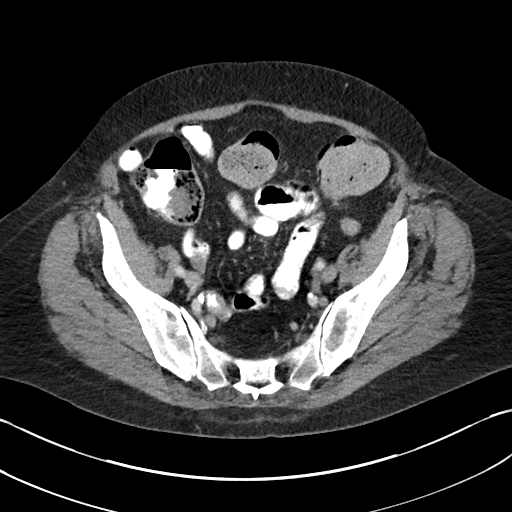
[im 37/88  soft-tissue]
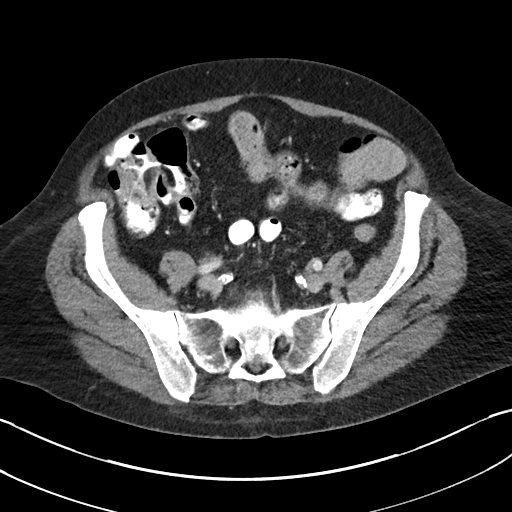
[im 46/88  soft-tissue]
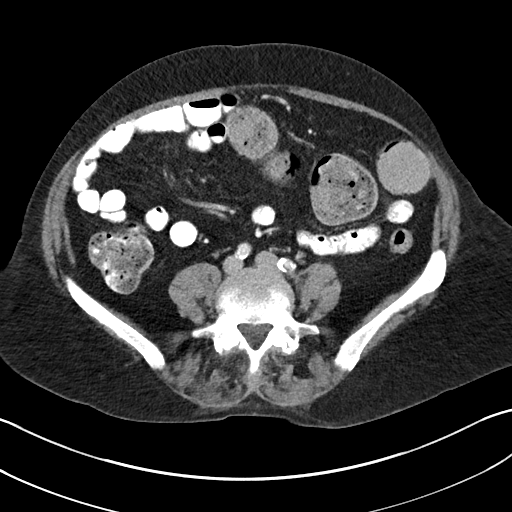
[im 51/88  soft-tissue]
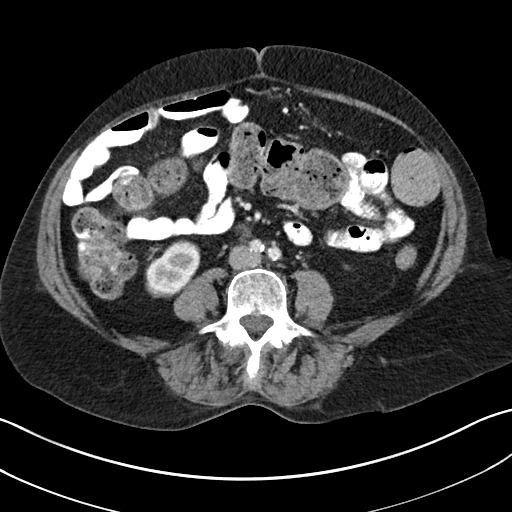
[im 55/88  soft-tissue]
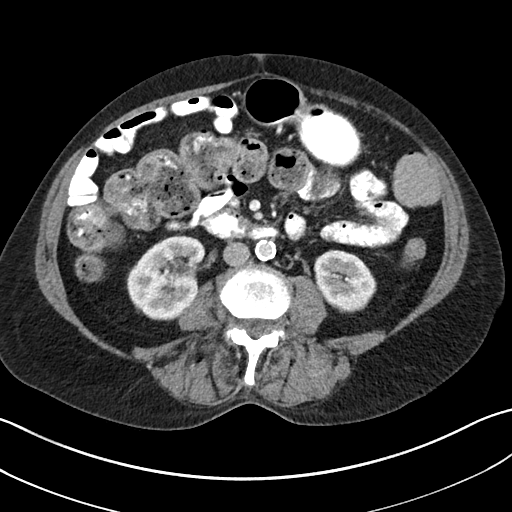
[im 55/88  bone]
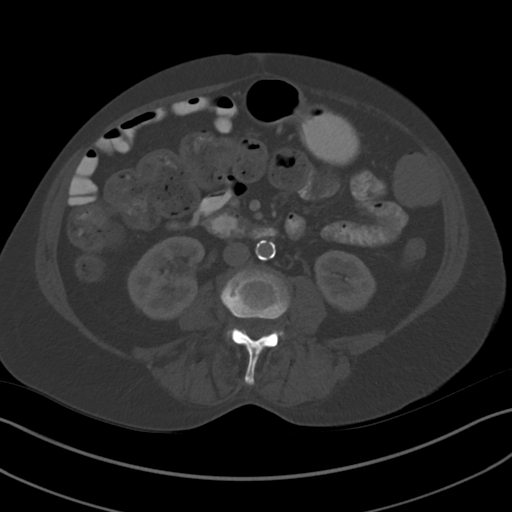
[im 65/88  soft-tissue]
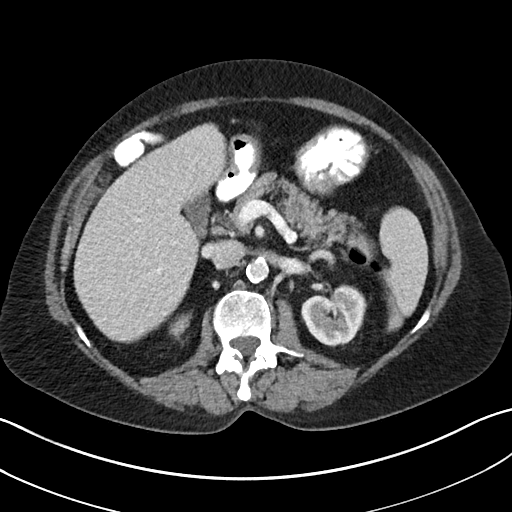
[im 69/88  soft-tissue]
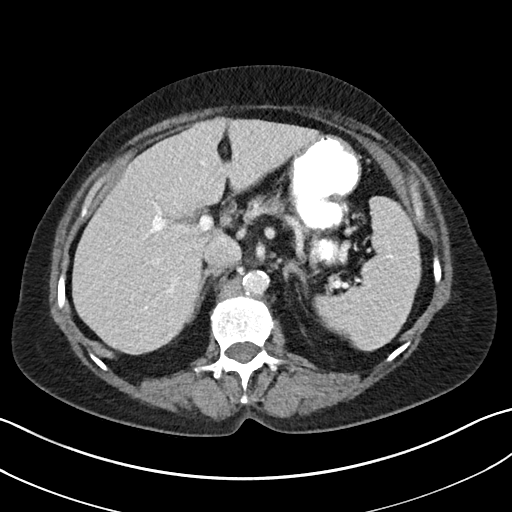
[im 74/88  soft-tissue]
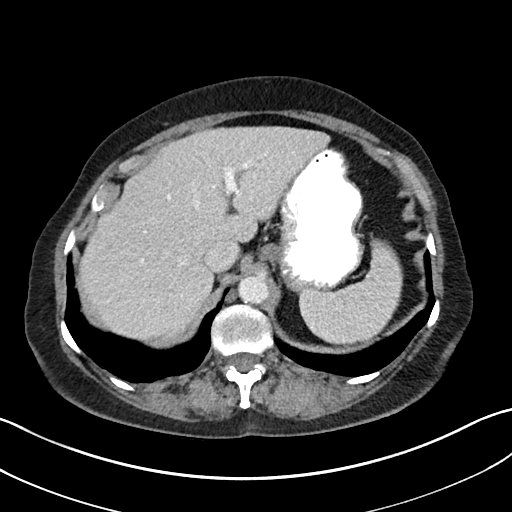
[im 83/88  soft-tissue]
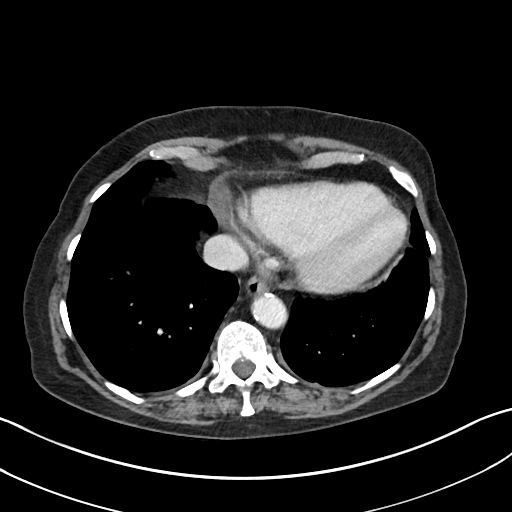

[Series 4: coronal abd pelvis · coronal · 0.73mm/px · 3 of 141 slices shown]
[im 47/141  soft-tissue]
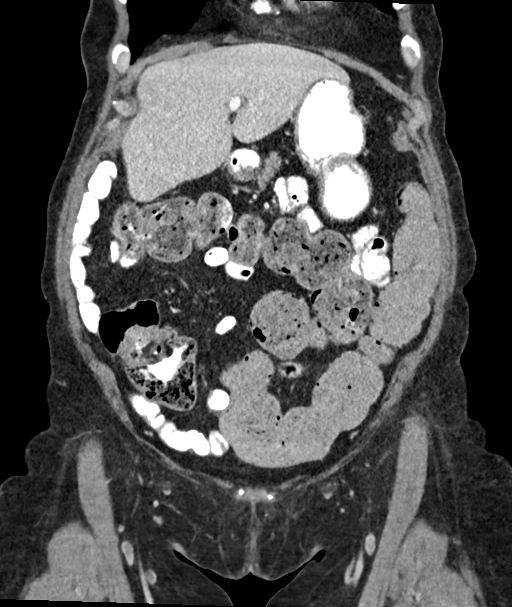
[im 63/141  soft-tissue]
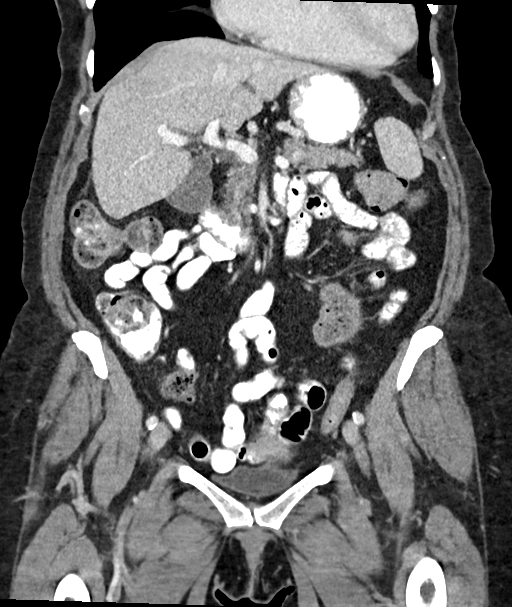
[im 78/141  soft-tissue]
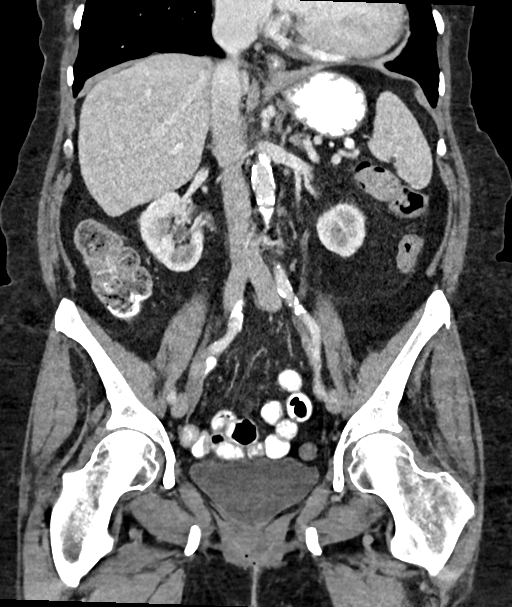

[16 of 46 positions shown; findings below may reference images not displayed]

FINDINGS: Lower chest: No acute abnormality.  Cardiomegaly.

Hepatobiliary: No solid liver abnormality is seen. Tiny gallstones
or sludge in the gallbladder. No gallbladder wall thickening, or
biliary dilatation.

Pancreas: Unremarkable. No pancreatic ductal dilatation or
surrounding inflammatory changes.

Spleen: Normal in size without significant abnormality.

Adrenals/Urinary Tract: Adrenal glands are unremarkable. Kidneys are
normal, without renal calculi, solid lesion, or hydronephrosis.
Bladder is unremarkable.

Stomach/Bowel: Stomach is within normal limits. Appendix is
surgically absent. There is abrupt caliber change of the colon at
the level of the splenic flexure, with a large burden of stool in
the proximal colon. The descending colon through the rectum are
decompressed.

Vascular/Lymphatic: Aortic atherosclerosis. No enlarged abdominal or
pelvic lymph nodes.

Reproductive: Status post hysterectomy.

Other: No abdominal wall hernia or abnormality. No abdominopelvic
ascites.

Musculoskeletal: No acute or significant osseous findings.
IMPRESSION: 1. There is abrupt caliber change of the colon at the level of the
splenic flexure, with a large burden of stool in the proximal colon.
The descending colon through the rectum are decompressed. There is
no obvious mass at this level. The presence of mass or stricture may
be further evaluated by colonoscopy or barium enema.

2.  Cholelithiasis.

## 2021-05-02 IMAGING — NM NM BOWEL IMG MECKELS
1 series · 6 of 6 positions shown · non-contrast
Comparison: CT abdomen 11/11/2018

CLINICAL DATA: Gastrointestinal bleeding. Melena. Evaluate for
Meckel's diverticulum

EXAM:
NUCLEAR MEDICINE MECKELS SCAN
TECHNIQUE: Sequential abdominal images were obtained following intravenous
injection of radiopharmaceutical.
RADIOPHARMACEUTICALS:  9.98 millicuries technetium pertechnetate

[Series 1000: meckels scan · 4.80mm/px · 6 of 60 frames shown]
[frame 6/60]
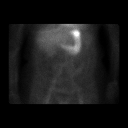
[frame 16/60]
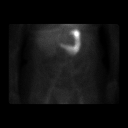
[frame 26/60]
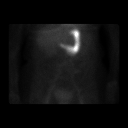
[frame 36/60]
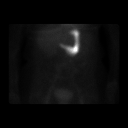
[frame 46/60]
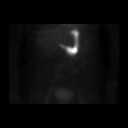
[frame 56/60]
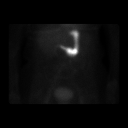

[6 of 6 positions shown; findings below may reference images not displayed]

FINDINGS: No abnormal radiotracer accumulation within the abdomen pelvis to
localize ectopic gastric mucosa (Meckel's diverticulum). Physiologic
activity noted within the stomach.
IMPRESSION: No scintigraphic evidence of ectopic gastric mucosa (Meckel's
diverticulum).

## 2023-02-11 ENCOUNTER — Ambulatory Visit
Admission: RE | Admit: 2023-02-11 | Discharge: 2023-02-11 | Disposition: A | Discharge status: Home / Self Care | Payer: Medicare PPO | Source: Ambulatory Visit | Attending: Physician Assistant | Admitting: Physician Assistant

## 2023-02-11 ENCOUNTER — Other Ambulatory Visit: Payer: Self-pay | Admitting: Physician Assistant

## 2023-02-11 DIAGNOSIS — S299XXD Unspecified injury of thorax, subsequent encounter: Secondary | ICD-10-CM | POA: Diagnosis present

## 2023-02-11 MED ORDER — IOHEXOL 300 MG/ML  SOLN
75.0000 mL | Freq: Once | INTRAMUSCULAR | Status: AC | PRN
  Administered 2023-02-11: 75 mL via INTRAVENOUS
